# Patient Record
Sex: Female | Born: 1955 | Race: White | Hispanic: No | Smoking: Never smoker
Health system: Southern US, Community
[De-identification: ages and names within clinical notes are randomized; demographics above are authoritative.]

## PROBLEM LIST (undated history)

## (undated) DIAGNOSIS — D649 Anemia, unspecified: Secondary | ICD-10-CM

## (undated) DIAGNOSIS — G4700 Insomnia, unspecified: Secondary | ICD-10-CM

## (undated) DIAGNOSIS — M1990 Unspecified osteoarthritis, unspecified site: Secondary | ICD-10-CM

## (undated) DIAGNOSIS — I10 Essential (primary) hypertension: Secondary | ICD-10-CM

## (undated) DIAGNOSIS — K635 Polyp of colon: Secondary | ICD-10-CM

## (undated) DIAGNOSIS — E785 Hyperlipidemia, unspecified: Secondary | ICD-10-CM

## (undated) DIAGNOSIS — G43909 Migraine, unspecified, not intractable, without status migrainosus: Secondary | ICD-10-CM

## (undated) DIAGNOSIS — K2970 Gastritis, unspecified, without bleeding: Secondary | ICD-10-CM

## (undated) DIAGNOSIS — E119 Type 2 diabetes mellitus without complications: Secondary | ICD-10-CM

## (undated) DIAGNOSIS — K219 Gastro-esophageal reflux disease without esophagitis: Secondary | ICD-10-CM

## (undated) DIAGNOSIS — F419 Anxiety disorder, unspecified: Secondary | ICD-10-CM

## (undated) HISTORY — PX: BILATERAL CARPAL TUNNEL RELEASE: SHX6508

## (undated) HISTORY — PX: HAMMER TOE SURGERY: SHX385

---

## 2004-02-17 ENCOUNTER — Ambulatory Visit: Payer: Self-pay | Admitting: Family Medicine

## 2005-10-04 ENCOUNTER — Ambulatory Visit: Payer: Self-pay | Admitting: Family Medicine

## 2005-12-16 ENCOUNTER — Emergency Department: Payer: Self-pay | Admitting: Emergency Medicine

## 2005-12-16 ENCOUNTER — Other Ambulatory Visit: Payer: Self-pay

## 2006-11-21 ENCOUNTER — Ambulatory Visit: Payer: Self-pay | Admitting: Family Medicine

## 2006-11-25 ENCOUNTER — Ambulatory Visit: Payer: Self-pay | Admitting: Family Medicine

## 2007-01-09 DIAGNOSIS — K219 Gastro-esophageal reflux disease without esophagitis: Secondary | ICD-10-CM | POA: Insufficient documentation

## 2007-01-09 DIAGNOSIS — E119 Type 2 diabetes mellitus without complications: Secondary | ICD-10-CM

## 2007-01-09 DIAGNOSIS — I10 Essential (primary) hypertension: Secondary | ICD-10-CM | POA: Diagnosis present

## 2007-03-21 ENCOUNTER — Emergency Department: Payer: Self-pay | Admitting: Emergency Medicine

## 2008-01-08 ENCOUNTER — Ambulatory Visit: Payer: Self-pay | Admitting: Family Medicine

## 2008-01-23 ENCOUNTER — Emergency Department: Payer: Self-pay | Admitting: Emergency Medicine

## 2008-03-12 ENCOUNTER — Emergency Department: Payer: Self-pay | Admitting: Emergency Medicine

## 2008-03-16 DIAGNOSIS — E785 Hyperlipidemia, unspecified: Secondary | ICD-10-CM | POA: Diagnosis present

## 2009-03-08 ENCOUNTER — Ambulatory Visit: Payer: Self-pay | Admitting: Family Medicine

## 2009-11-13 ENCOUNTER — Emergency Department: Payer: Self-pay | Admitting: Emergency Medicine

## 2010-03-30 ENCOUNTER — Ambulatory Visit: Payer: Self-pay | Admitting: Family Medicine

## 2011-01-18 ENCOUNTER — Ambulatory Visit: Payer: Self-pay | Admitting: Internal Medicine

## 2011-03-01 DIAGNOSIS — F419 Anxiety disorder, unspecified: Secondary | ICD-10-CM | POA: Insufficient documentation

## 2011-05-24 ENCOUNTER — Ambulatory Visit: Payer: Self-pay | Admitting: Family Medicine

## 2012-08-05 ENCOUNTER — Ambulatory Visit: Payer: Self-pay | Admitting: Family Medicine

## 2013-01-14 ENCOUNTER — Emergency Department: Payer: Self-pay | Admitting: Emergency Medicine

## 2013-01-14 LAB — COMPREHENSIVE METABOLIC PANEL
Albumin: 4.4 g/dL (ref 3.4–5.0)
Alkaline Phosphatase: 107 U/L (ref 50–136)
Anion Gap: 7 (ref 7–16)
BUN: 14 mg/dL (ref 7–18)
Bilirubin,Total: 0.4 mg/dL (ref 0.2–1.0)
Calcium, Total: 8.9 mg/dL (ref 8.5–10.1)
Chloride: 99 mmol/L (ref 98–107)
Creatinine: 0.89 mg/dL (ref 0.60–1.30)
EGFR (Non-African Amer.): >60
Glucose: 176 mg/dL — ABNORMAL HIGH (ref 65–99)
Osmolality: 271 (ref 275–301)
SGOT(AST): 21 U/L (ref 15–37)

## 2013-01-14 LAB — LIPASE, BLOOD: Lipase: 169 U/L (ref 73–393)

## 2013-01-14 LAB — CBC
HCT: 36 % (ref 35.0–47.0)
HGB: 12.1 g/dL (ref 12.0–16.0)
MCHC: 33.6 g/dL (ref 32.0–36.0)
Platelet: 287 10*3/uL (ref 150–440)
RBC: 4.89 10*6/uL (ref 3.80–5.20)
RDW: 15.1 % — ABNORMAL HIGH (ref 11.5–14.5)
WBC: 8.9 10*3/uL (ref 3.6–11.0)

## 2013-01-14 LAB — URINALYSIS, COMPLETE
Leukocyte Esterase: NEGATIVE
Nitrite: NEGATIVE
Protein: NEGATIVE
RBC,UR: <1 /HPF (ref 0–5)
Squamous Epithelial: <1
WBC UR: <1 /HPF (ref 0–5)

## 2013-03-03 DIAGNOSIS — M5412 Radiculopathy, cervical region: Secondary | ICD-10-CM | POA: Insufficient documentation

## 2013-06-08 DIAGNOSIS — M47812 Spondylosis without myelopathy or radiculopathy, cervical region: Secondary | ICD-10-CM | POA: Insufficient documentation

## 2014-01-14 ENCOUNTER — Ambulatory Visit: Payer: Self-pay | Admitting: Family Medicine

## 2014-05-24 DIAGNOSIS — L853 Xerosis cutis: Secondary | ICD-10-CM | POA: Insufficient documentation

## 2014-10-10 ENCOUNTER — Emergency Department
Admission: EM | Admit: 2014-10-10 | Discharge: 2014-10-10 | Disposition: A | Discharge status: Home / Self Care | Attending: Emergency Medicine | Admitting: Emergency Medicine

## 2014-10-10 ENCOUNTER — Encounter: Payer: Self-pay | Admitting: Emergency Medicine

## 2014-10-10 ENCOUNTER — Emergency Department

## 2014-10-10 ENCOUNTER — Other Ambulatory Visit: Payer: Self-pay

## 2014-10-10 DIAGNOSIS — G43909 Migraine, unspecified, not intractable, without status migrainosus: Secondary | ICD-10-CM | POA: Diagnosis not present

## 2014-10-10 DIAGNOSIS — E119 Type 2 diabetes mellitus without complications: Secondary | ICD-10-CM | POA: Diagnosis not present

## 2014-10-10 DIAGNOSIS — I10 Essential (primary) hypertension: Secondary | ICD-10-CM | POA: Diagnosis not present

## 2014-10-10 HISTORY — DX: Type 2 diabetes mellitus without complications: E11.9

## 2014-10-10 HISTORY — DX: Essential (primary) hypertension: I10

## 2014-10-10 HISTORY — DX: Migraine, unspecified, not intractable, without status migrainosus: G43.909

## 2014-10-10 HISTORY — DX: Hyperlipidemia, unspecified: E78.5

## 2014-10-10 HISTORY — DX: Unspecified osteoarthritis, unspecified site: M19.90

## 2014-10-10 LAB — SEDIMENTATION RATE: SED RATE: 10 mm/h (ref 0–30)

## 2014-10-10 LAB — BASIC METABOLIC PANEL
Anion gap: 7 (ref 5–15)
BUN: 12 mg/dL (ref 6–20)
CALCIUM: 8.8 mg/dL — AB (ref 8.9–10.3)
CO2: 28 mmol/L (ref 22–32)
Chloride: 99 mmol/L — ABNORMAL LOW (ref 101–111)
Creatinine, Ser: 0.61 mg/dL (ref 0.44–1.00)
GFR calc Af Amer: >60 mL/min (ref >60)
GFR calc non Af Amer: >60 mL/min (ref >60)
GLUCOSE: 129 mg/dL — AB (ref 65–99)
POTASSIUM: 3.3 mmol/L — AB (ref 3.5–5.1)
Sodium: 134 mmol/L — ABNORMAL LOW (ref 135–145)

## 2014-10-10 LAB — CBC
HEMATOCRIT: 33 % — AB (ref 35.0–47.0)
HEMOGLOBIN: 10.7 g/dL — AB (ref 12.0–16.0)
MCH: 24 pg — AB (ref 26.0–34.0)
MCHC: 32.6 g/dL (ref 32.0–36.0)
MCV: 73.7 fL — AB (ref 80.0–100.0)
PLATELETS: 241 10*3/uL (ref 150–440)
RBC: 4.47 MIL/uL (ref 3.80–5.20)
RDW: 14.6 % — AB (ref 11.5–14.5)
WBC: 6.2 10*3/uL (ref 3.6–11.0)

## 2014-10-10 LAB — TROPONIN I: Troponin I: <0.03 ng/mL (ref <0.031)

## 2014-10-10 MED ORDER — KETOROLAC TROMETHAMINE 30 MG/ML IJ SOLN
INTRAMUSCULAR | Status: AC
  Filled 2014-10-10: qty 1

## 2014-10-10 MED ORDER — METOCLOPRAMIDE HCL 5 MG/ML IJ SOLN
INTRAMUSCULAR | Status: AC
  Filled 2014-10-10: qty 2

## 2014-10-10 MED ORDER — DIPHENHYDRAMINE HCL 50 MG/ML IJ SOLN
INTRAMUSCULAR | Status: AC
  Filled 2014-10-10: qty 1

## 2014-10-10 MED ORDER — IBUPROFEN 800 MG PO TABS
ORAL_TABLET | ORAL | Status: AC
  Filled 2014-10-10: qty 1

## 2014-10-10 MED ORDER — ACETAMINOPHEN 500 MG PO TABS
ORAL_TABLET | ORAL | Status: AC
  Filled 2014-10-10: qty 2

## 2014-10-10 MED ORDER — METOCLOPRAMIDE HCL 10 MG PO TABS
ORAL_TABLET | ORAL | Status: AC
  Filled 2014-10-10: qty 1

## 2014-10-10 MED ORDER — SUMATRIPTAN SUCCINATE 50 MG PO TABS: 50.0000 mg | ORAL_TABLET | Freq: Once | ORAL | Status: DC | PRN

## 2014-10-10 MED ORDER — SODIUM CHLORIDE 0.9 % IV SOLN: Freq: Once | INTRAVENOUS | Status: DC

## 2014-10-10 MED ORDER — ACETAMINOPHEN 500 MG PO TABS
1000.0000 mg | ORAL_TABLET | Freq: Once | ORAL | Status: AC
  Administered 2014-10-10: 1000 mg via ORAL

## 2014-10-10 MED ORDER — METOCLOPRAMIDE HCL 10 MG PO TABS
10.0000 mg | ORAL_TABLET | Freq: Once | ORAL | Status: AC
  Administered 2014-10-10: 10 mg via ORAL

## 2014-10-10 MED ORDER — DIPHENHYDRAMINE HCL 50 MG/ML IJ SOLN: 25.0000 mg | Freq: Once | INTRAMUSCULAR | Status: DC

## 2014-10-10 MED ORDER — IBUPROFEN 800 MG PO TABS
800.0000 mg | ORAL_TABLET | Freq: Once | ORAL | Status: AC
  Administered 2014-10-10: 800 mg via ORAL

## 2014-10-10 MED ORDER — KETOROLAC TROMETHAMINE 30 MG/ML IJ SOLN: 30.0000 mg | Freq: Once | INTRAMUSCULAR | Status: DC

## 2014-10-10 MED ORDER — METOCLOPRAMIDE HCL 5 MG/ML IJ SOLN: 10.0000 mg | Freq: Once | INTRAMUSCULAR | Status: DC

## 2014-10-10 NOTE — Discharge Instructions (Signed)

## 2014-10-10 NOTE — ED Provider Notes (Addendum)
[facility] Emergency Department Provider Note     Time seen: ----------------------------------------- 5:37 PM on 10/10/2014 -----------------------------------------    I have reviewed the triage vital signs and the nursing notes.   HISTORY  Chief Complaint Migraine    HPI Pamela Heath is a 59 y.o. female emergency ER for left-sided headache on and off last 2 months. Patient states its severe over the last 2 days, she normally works nights and has not been able to sleep due to the pain. She states she has a history of migraines but this is different than her typical headache. Denies fevers chills other complaints, nothing seems to make it better or worse. Has been seen by an eye doctor and was checked for glaucoma was told her I pressure was normal.   Past Medical History  Diagnosis Date  . Hypertension   . Diabetes mellitus without complication   . Migraines   . Hyperlipemia   . Arthritis     There are no active problems to display for this patient.   Past Surgical History  Procedure Laterality Date  . Bilateral carpal tunnel release    . Hammer toe surgery      Allergies Neurontin  Social History History  Substance Use Topics  . Smoking status: Never Smoker   . Smokeless tobacco: Never Used  . Alcohol Use: No    Review of Systems Constitutional: Negative for fever. Eyes: Positive for vision changes ENT: Negative for sore throat. Cardiovascular: Negative for chest pain. Respiratory: Negative for shortness of breath. Gastrointestinal: Negative for abdominal pain, vomiting and diarrhea. Genitourinary: Negative for dysuria. Musculoskeletal: Negative for back pain. Skin: Negative for rash. Neurological: Positive for headache on left side  10-point ROS otherwise negative.  ____________________________________________   PHYSICAL EXAM:  VITAL SIGNS: ED Triage Vitals  Enc Vitals Group     BP 10/10/14 1539 144/69 mmHg    Pulse Rate 10/10/14 1539 83     Resp 10/10/14 1539 18     Temp 10/10/14 1539 98 F (36.7 C)     Temp Source 10/10/14 1539 Oral     SpO2 10/10/14 1539 98 %     Weight 10/10/14 1539 160 lb (72.576 kg)     Height 10/10/14 1539 5\' 2"  (1.575 m)     Head Cir --      Peak Flow --      Pain Score 10/10/14 1542 8     Pain Loc --      Pain Edu? --      Excl. in GC? --     Constitutional: Alert and oriented. Well appearing and in no distress. Eyes: Conjunctivae are normal. PERRL. Normal extraocular movements. ENT   Head: Normocephalic and atraumatic.   Nose: No congestion/rhinnorhea.   Mouth/Throat: Mucous membranes are moist.   Neck: No stridor. Hematological/Lymphatic/Immunilogical: No cervical lymphadenopathy. Cardiovascular: Normal rate, regular rhythm. Normal and symmetric distal pulses are present in all extremities. No murmurs, rubs, or gallops. Respiratory: Normal respiratory effort without tachypnea nor retractions. Breath sounds are clear and equal bilaterally. No wheezes/rales/rhonchi. Gastrointestinal: Soft and nontender. No distention. No abdominal bruits. There is no CVA tenderness. Musculoskeletal: Nontender with normal range of motion in all extremities. No joint effusions.  No lower extremity tenderness nor edema. Neurologic:  Normal speech and language. No gross focal neurologic deficits are appreciated. Speech is normal. No gait instability. Skin:  Skin is warm, dry and intact. No rash noted. Psychiatric: Mood and affect are normal. Speech and behavior  are normal. Patient exhibits appropriate insight and judgment.  ____________________________________________  ED COURSE:  Pertinent labs & imaging results that were available during my care of the patient were reviewed by me and considered in my medical decision making (see chart for details). Patient will need a CT head, basic labs and reevaluation. ____________________________________________    LABS  (pertinent positives/negatives)  Labs Reviewed  CBC - Abnormal; Notable for the following:    Hemoglobin 10.7 (*)    HCT 33.0 (*)    MCV 73.7 (*)    MCH 24.0 (*)    RDW 14.6 (*)    All other components within normal limits  BASIC METABOLIC PANEL - Abnormal; Notable for the following:    Sodium 134 (*)    Potassium 3.3 (*)    Chloride 99 (*)    Glucose, Bld 129 (*)    Calcium 8.8 (*)    All other components within normal limits  TROPONIN I  SEDIMENTATION RATE    RADIOLOGY  FINDINGS: Normal appearing cerebral hemispheres and posterior fossa structures. Normal size and position of the ventricles. No intracranial hemorrhage, mass lesion or CT evidence of acute infarction. Unremarkable bones and included paranasal sinuses.  IMPRESSION: Normal examination.  ____________________________________________  FINAL ASSESSMENT AND PLAN  Headache  Plan: Unclear etiology for this headache. Her labs and workup here been unremarkable. Neurologic exam is normal. Should be advised to have close follow-up with her doctor for reevaluation. Sedimentation rate is normal.   Emily Filbert, MD   Emily Filbert, MD 10/10/14 1739  Emily Filbert, MD 10/10/14 [phone]

## 2014-10-10 NOTE — ED Notes (Signed)
Patient to Ed with c.o left sided headache that has been present " off an on" for two months. Patient states pain has been severe over the past two day. Patient denies any visual changes. Patient states she has a history of migraines. Patient states this pain feels different than previous headaches. No neurological deficits noted. Patient has clear speech. Patient can follow instructions and answer questions.

## 2014-10-10 NOTE — ED Notes (Addendum)
Headache for 2 months on left side. Hx of Migraines. Denies visual changes. For the past 2 nights pain in left head and arm pain. Pt states all my pain is in the left side.

## 2014-12-02 DIAGNOSIS — R519 Headache, unspecified: Secondary | ICD-10-CM | POA: Insufficient documentation

## 2015-01-05 ENCOUNTER — Other Ambulatory Visit: Payer: Self-pay | Admitting: Neurology

## 2015-01-05 DIAGNOSIS — M542 Cervicalgia: Secondary | ICD-10-CM

## 2015-01-13 ENCOUNTER — Ambulatory Visit: Attending: Neurology

## 2015-01-13 ENCOUNTER — Ambulatory Visit
Admission: RE | Admit: 2015-01-13 | Discharge: 2015-01-13 | Disposition: A | Discharge status: Home / Self Care | Source: Ambulatory Visit | Attending: Neurology | Admitting: Neurology

## 2015-01-13 DIAGNOSIS — M542 Cervicalgia: Secondary | ICD-10-CM | POA: Insufficient documentation

## 2015-01-13 DIAGNOSIS — M4802 Spinal stenosis, cervical region: Secondary | ICD-10-CM | POA: Insufficient documentation

## 2015-01-13 NOTE — Patient Instructions (Addendum)
[email or web address] Cervical retractions 2x10 in sitting or supine

## 2015-01-13 NOTE — Therapy (Addendum)
[place] [facility] MAIN [facility] SERVICES [address] [place], [place], [address] Phone: [phone]   Fax:  [phone]  Physical Therapy Evaluation  Patient Details  Name: CAMANI SESAY MRN: [id number] Date of Birth: [birth year] Referring Provider:  Morene Crocker, MD  Encounter Date: 01/13/2015      PT End of Session - 01/13/15 1819    Visit Number 1   Number of Visits 9   Date for PT Re-Evaluation 02/10/15   PT Start Time 1400   PT Stop Time 1500   PT Time Calculation (min) 60 min   Activity Tolerance Patient tolerated treatment well   Behavior During Therapy [facility] for tasks assessed/performed      Past Medical History  Diagnosis Date  . Hypertension   . Diabetes mellitus without complication   . Migraines   . Hyperlipemia   . Arthritis     Past Surgical History  Procedure Laterality Date  . Bilateral carpal tunnel release    . Hammer toe surgery      There were no vitals filed for this visit.  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  In June she went to the ER because she was having a severe headache but did not have a CVA.  She had cervical MRI performed this morning and will have the results soon.  Her MD offered her injects and she declined so he referred her for PT.  Pt notes her pain has worsen in the past two years and turning left increases her neck symptoms (stiffness and numbness/tingling).  She has noticed reading and increases her symptoms.  She currently has not symptoms.    Patient Stated Goals to decrease her stiffness in her neck    Currently in Pain? No/denies            [facility] PT Assessment - 01/13/15 0001    Assessment   Medical Diagnosis neck pain   Onset Date/Surgical Date 01/12/13   Hand Dominance Right   Next MD Visit 04/12/15   Prior Therapy none   Precautions   Precautions None   Restrictions   Weight Bearing Restrictions No   Balance Screen   Has the patient fallen in the past 6 months No   Has the patient had a decrease in activity level because of a fear of falling?  No   Is the patient reluctant to leave their home because of a fear of falling?  No   Home Environment   Living Environment Private residence   Living Arrangements Alone   Available Help at Discharge Family   Type of Home Mobile home   Home Access Stairs to enter   Entrance Stairs-Number of Steps 4   Entrance Stairs-Rails Right;Left   Home Layout One level   Home Equipment None   Prior Function   Level of Independence Independent   Vocation Full time employment   Lobbyist   Leisure read, walk   Cognition   Overall Cognitive Status Within Functional Limits for tasks assessed   Sensation   Light Touch Appears Intact   Coordination   Gross Motor Movements are Fluid and Coordinated Yes       PAIN: 0/10  POSTURE: Rounded shoulders, forward head, increased  kyphosis in sitting  AROM: Cervical flexion 40 Cervical extension: 38 R cervical rotation: 60 Left cervical rotation: 45 with pain    Left passive cervical rotation no pain and improved ROM ~50  Gross UE ROM and strength: WNL  Dermatomes UE WNL Myotomes WNL    SPECIAL TESTS: Spurling's: negative    Palpation:  No pain with Grade 4 mobs in cervical spine Pain with Grade 2 mob T1 Increased tension and pain in left upper trap   There ex:  Cervical retractions in supine and sitting x10 each Pt required mod-max verbal and tactile cues for correct technique Educated pt on using cervical roll while sleeping                     PT Education - 01/13/15 1818    Education provided Yes   Education Details plan of care, HEP (cervical retractions)    Person(s) Educated Patient    Methods Explanation;Demonstration   Comprehension Verbalized understanding;Returned demonstration;Verbal cues required             PT Long Term Goals - 01/13/15 1826    PT LONG TERM GOAL #1   Title pt will achieve 60 degrees of left cervical rotation to improve driving ability    Baseline 45 degrees   Time 4   Period Weeks   Status New   PT LONG TERM GOAL #2   Title pt will be independent with HEP to self manage her symptoms   Baseline pt required mod-max verbal cues for correct cervical retractions    Time 4   Period Weeks   Status New   PT LONG TERM GOAL #3   Title pt will be able to read for 20 minutes whout having neck symptoms      Baseline pt reports having neck stiffness when reading her bible     Time 4     Period Weeks   Status New               Plan - 01/13/15 1822    Clinical Impression Statement Pt is a 59 year old female with chronic history of neck pain.  Based on her history and exam, pt's symptoms are consisted with musculoskeletal involvement, she does not seem to have a peripheral nerve involvement regarding the cervical spine at this time.  Pt presents with decreased R cervical rotation and cervical retraction, and postural dysfunction.  Pt would benefit from skilled PT services to improve posture, ROM to return to PLOF.     Pt will benefit from skilled therapeutic intervention in order to improve on the following deficits Decreased range of motion;Increased fascial restricitons;Pain;Postural dysfunction;Decreased strength   Rehab Potential Good   PT Frequency 2x / week   PT Duration 4 weeks   PT Treatment/Interventions Aquatic Therapy;Cryotherapy;Moist Heat;Therapeutic exercise;Therapeutic activities;Functional mobility training;Manual techniques;Passive range of motion   PT Next Visit Plan increase postural strength          Problem List There are no active problems to display for this patient. Janus Molder, SPT This entire session was  performed under direct supervision and direction of a licensed Estate agent . I have personally read, edited and approve of the note as written.  Carlyon Shadow. Tortorici, PT, DPT [phone]   Tortorici,Ashley 01/13/2015, 6:47 PM  [place] [facility] MAIN [facility] SERVICES [address] [place], [place], [address] Phone: [phone]   Fax:  [phone]

## 2015-01-17 ENCOUNTER — Encounter

## 2015-01-19 ENCOUNTER — Encounter

## 2015-01-24 ENCOUNTER — Encounter

## 2015-01-27 ENCOUNTER — Encounter: Admitting: Physical Therapy

## 2015-01-27 DIAGNOSIS — G4700 Insomnia, unspecified: Secondary | ICD-10-CM | POA: Insufficient documentation

## 2015-01-31 ENCOUNTER — Encounter

## 2015-02-01 ENCOUNTER — Ambulatory Visit: Attending: Neurology

## 2015-02-01 DIAGNOSIS — M542 Cervicalgia: Secondary | ICD-10-CM | POA: Diagnosis not present

## 2015-02-02 NOTE — Therapy (Signed)
[place] [facility] [address] [place], [place], [address] Phone: [phone]   Fax:  [phone]  Physical Therapy Treatment  Patient Details  Name: Pamela Heath MRN: [id number] Date of Birth: [birth year] Referring Provider:  Morene CrockerPotter, Zachary E, MD  Encounter Date: 02/01/2015      PT End of Session - 02/02/15 1048    Visit Number 2   Number of Visits 9   Date for PT Re-Evaluation 02/10/15   PT Start Time 1645   PT Stop Time 1730   PT Time Calculation (min) 45 min   Activity Tolerance Patient tolerated treatment well   Behavior During Therapy [facility] for tasks assessed/performed      Past Medical History  Diagnosis Date  . Hypertension   . Diabetes mellitus without complication (HCC)   . Migraines   . Hyperlipemia   . Arthritis     Past Surgical History  Procedure Laterality Date  . Bilateral carpal tunnel release    . Hammer toe surgery      There were no vitals filed for this visit.  Visit Diagnosis:  Cervicalgia      Subjective Assessment - 02/02/15 1046    Subjective Pt relates her left sided neck pain is better since her last session and her symptoms decrease after doing her HEP.  Pt denies any pain currently   Patient Stated Goals to decrease her stiffness in her neck    Currently in Pain? No/denies        There ex: Scapular retractions 2x10, pt required mod-max verbal, visual and tactile cues to perform exercise, pt is inconsistent with exercise performance Chin tucks 2x10, pt required verbal, visual and tactile cues for correct technique  Left upper trap stretch x20 sec, increased tingling sensation in her neck Left levator stretch x20 sec, increased in tingling sensation in her neck  Pt required at least mod verbal, visual  and tactile cues for correct technique   Manual therapy: Cervical P/A mobs grade 2-3, 2x25 seconds each level,  Left lateral cervical glide grade 2-3 2x25 each level, pain  at C3 Passive cervical retractions x10 Cervical distraction x5 min with 10 sec pull/10 sec relax Pt reported decreased pain with left cervical rotation after manual therapy                            PT Education - 02/02/15 1047    Education provided Yes   Education Details plan of care and reviewed and update HEP   Person(s) Educated Patient   Methods Explanation   Comprehension Verbalized understanding             PT Long Term Goals - 01/13/15 1826    PT LONG TERM GOAL #1   Title pt will achieve 60 degrees of left cervical rotation to improve driving ability    Baseline 45 degrees   Time 4   Period Weeks   Status New   PT LONG TERM GOAL #2   Title pt will be independent with HEP to self manage her symptoms   Baseline pt required mod-max verbal cues for correct cervical retractions    Time 4   Period Weeks   Status New   PT LONG TERM GOAL #3   Title pt will be able to read for 20 minutes whout having neck symptoms   Simultaneous filing. User may not have seen previous data.   Baseline pt reports  having neck stiffness when reading her bible  Simultaneous filing. User may not have seen previous data.   Time 4  Simultaneous filing. User may not have seen previous data.   Period Weeks   Status New               Plan - 02/02/15 1049    Clinical Impression Statement Pt responded well to manual therapy with decreased pain and "stiffness."  Pt experienced tingling in her neck with stretches, which decreased after returning to neutral.  Pt will requires mod cueing to perform exercises due to poor body awareness   Pt will benefit from skilled therapeutic intervention in order to improve on the following deficits Decreased range of motion;Increased fascial restricitons;Pain;Postural dysfunction;Decreased strength   Rehab Potential Good   PT Frequency 2x / week   PT Duration 4 weeks   PT Treatment/Interventions Aquatic Therapy;Cryotherapy;Moist  Heat;Therapeutic exercise;Therapeutic activities;Functional mobility training;Manual techniques;Passive range of motion   PT Next Visit Plan increase postural strength         Problem List There are no active problems to display for this patient. Janus Molder, SPT This entire session was performed under direct supervision and direction of a licensed Estate agent . I have personally read, edited and approve of the note as written. Carlyon Shadow. Tortorici, PT, DPT [phone]   Tortorici,Ashley 02/02/2015, 10:53 AM  [place] [facility] MAIN [facility] SERVICES [address] [place], [place], [address] Phone: [phone]   Fax:  [phone]

## 2015-02-02 NOTE — Patient Instructions (Signed)
[email or web address] Scapular retractions 2x10 

## 2015-02-03 ENCOUNTER — Encounter

## 2015-02-08 ENCOUNTER — Encounter

## 2015-02-10 ENCOUNTER — Encounter

## 2015-02-10 ENCOUNTER — Ambulatory Visit

## 2015-02-15 ENCOUNTER — Ambulatory Visit

## 2015-02-15 ENCOUNTER — Encounter

## 2015-02-15 DIAGNOSIS — M542 Cervicalgia: Secondary | ICD-10-CM | POA: Diagnosis not present

## 2015-02-16 NOTE — Patient Instructions (Signed)
[email or web address] Left upper trap stretch 3x 30 sec Left levator scapulae stretch 3x30 sec

## 2015-02-16 NOTE — Therapy (Signed)
[place] MAIN [facility] SERVICES [address] [place], [place], [address] Phone: [phone]   Fax:  [phone]  Physical Therapy Treatment  Patient Details  Name: Pamela Heath MRN: [id number] Date of Birth: 07/13/1955 No Data Recorded  Encounter Date: 02/15/2015      PT End of Session - 02/16/15 0953    Visit Number 3   Number of Visits 9   Date for PT Re-Evaluation 03/16/15   PT Start Time 1600   PT Stop Time 1645   PT Time Calculation (min) 45 min   Activity Tolerance Patient tolerated treatment well   Behavior During Therapy [facility] for tasks assessed/performed      Past Medical History  Diagnosis Date  . Hypertension   . Diabetes mellitus without complication ([place])   . Migraines   . Hyperlipemia   . Arthritis     Past Surgical History  Procedure Laterality Date  . Bilateral carpal tunnel release    . Hammer toe surgery      There were no vitals filed for this visit.  Visit Diagnosis:  Cervicalgia      Subjective Assessment - 02/16/15 0952    Subjective Pt reports she has been having less tingling in her neck and that performing her exercises helps decrease her neck pain and using cervical pillow has also helped.     Patient Stated Goals to decrease her stiffness in her neck    Currently in Pain? No/denies      Manual therapy: Extensive soft tissue mobilization to left upper trap (ischemic compression, muscle stripping, effleurage) to decrease myofascial restrictions Pt noted decreased tension and improved mobility after manual treatment  There ex: Cervical AROM in sitting 60 degrees bilateral rotation but stiff to the left and with pain  45 degrees flexion  40 degrees extension L upper trap stretch 5x30 sec, pt required mod verbal, visual, and tactile cues for correct technique Scapular retractions x10, pt is inconsistent with exercise and required mod verbal, and tactile cues to decrease upper trap activation   Low row with red and yellow band x10 each, deferred after inconsistent performance with upper trap activation Shoulder extension with yellow and red and band x10 each, pt displays increased upper trap activation and is unable to perform exercise without shrugging her shoulders  Low row without resistance x 10 improved performance compared to with resistance but continues to demonstrate upper trap dominance  Wall slide with lift 2x10, pt required min cues for correct technique                           PT Education - 02/16/15 0952    Education provided Yes   Education Details plan of care, reviewed PT goals and benefits of soft tissue mobilization    Person(s) Educated Patient   Methods Explanation   Comprehension Verbalized understanding             PT Long Term Goals - 02/16/15 0956    PT LONG TERM GOAL #1   Title pt will achieve 60 degrees of left cervical rotation to improve driving ability    Baseline 45 degrees on 9/22; on 10/25 60 degrees with pain    Time 4   Period Weeks   Status Partially Met   PT LONG TERM GOAL #2   Title pt will be independent with HEP to self manage her symptoms   Baseline pt requires reinstruciton on correct exercise  technique    Time 4   Period Weeks   Status On-going   PT LONG TERM GOAL #3   Title pt will be able to read for 20 minutes without having neck symptoms    Baseline pt reports having neck stiffness when reading her bible on 9/22; on 10/26 pt reports no neck symptoms when reading her bible    Time 4   Period Weeks   Status Achieved   PT LONG TERM GOAL #4   Title pt will report decreased pain, less than 5/10 when lifting objects at work   Baseline pt reports pain in her neck when lifting objects from 3-7/10    Time 4   Period Weeks   Status New               Plan - 02/16/15 0955    Clinical Impression Statement Pt has met or partially met her initial goals for PT and demonstrates improved cervical  ROM.  Pt continues to experience right upper thoracic/low cervical pain/stiffness which improved after soft tissue mobilization and stretches.  Pt presents with upper trap dominance throughout session and will require further instruction to decrease upper trap involvement with functional activities to decrease pain.  pt would benefit from continued PT services to make further gains and decrease pain with functional movements.     Pt will benefit from skilled therapeutic intervention in order to improve on the following deficits Decreased range of motion;Increased fascial restricitons;Pain;Postural dysfunction;Decreased strength   Rehab Potential Good   PT Frequency 2x / week   PT Duration 4 weeks   PT Treatment/Interventions Aquatic Therapy;Cryotherapy;Moist Heat;Therapeutic exercise;Therapeutic activities;Functional mobility training;Manual techniques;Passive range of motion   PT Next Visit Plan increase postural strength. manual therapy         Problem List There are no active problems to display for this patient.  Renford Dills, SPT This entire session was performed under direct supervision and direction of a licensed Chiropractor . I have personally read, edited and approve of the note as written. Gorden Harms. Tortorici, PT, DPT [phone]  Tortorici,Ashley 02/16/2015, 1:15 PM  [place] MAIN [facility] SERVICES [address] [place], [place], [address] Phone: [phone]   Fax:  [phone]  Name: Pamela Heath MRN: [id number] Date of Birth: 11/06/1955

## 2015-02-17 ENCOUNTER — Encounter

## 2015-02-24 ENCOUNTER — Ambulatory Visit

## 2015-05-24 ENCOUNTER — Other Ambulatory Visit: Payer: Self-pay | Admitting: Physician Assistant

## 2015-05-24 DIAGNOSIS — G8929 Other chronic pain: Secondary | ICD-10-CM

## 2015-05-24 DIAGNOSIS — K2100 Gastro-esophageal reflux disease with esophagitis, without bleeding: Secondary | ICD-10-CM

## 2015-05-24 DIAGNOSIS — R1032 Left lower quadrant pain: Principal | ICD-10-CM

## 2015-05-27 ENCOUNTER — Ambulatory Visit

## 2015-06-01 ENCOUNTER — Ambulatory Visit
Admission: RE | Admit: 2015-06-01 | Discharge: 2015-06-01 | Disposition: A | Discharge status: Home / Self Care | Source: Ambulatory Visit | Attending: Physician Assistant | Admitting: Physician Assistant

## 2015-06-01 DIAGNOSIS — K219 Gastro-esophageal reflux disease without esophagitis: Secondary | ICD-10-CM | POA: Diagnosis not present

## 2015-06-01 DIAGNOSIS — K2100 Gastro-esophageal reflux disease with esophagitis, without bleeding: Secondary | ICD-10-CM

## 2015-06-01 DIAGNOSIS — R1031 Right lower quadrant pain: Secondary | ICD-10-CM | POA: Diagnosis present

## 2015-06-01 DIAGNOSIS — G8929 Other chronic pain: Secondary | ICD-10-CM

## 2015-06-01 DIAGNOSIS — R1032 Left lower quadrant pain: Secondary | ICD-10-CM

## 2015-06-01 MED ORDER — IOHEXOL 300 MG/ML  SOLN
100.0000 mL | Freq: Once | INTRAMUSCULAR | Status: AC | PRN
  Administered 2015-06-01: 100 mL via INTRAVENOUS

## 2015-06-10 ENCOUNTER — Ambulatory Visit: Admitting: Anesthesiology

## 2015-06-10 ENCOUNTER — Encounter: Payer: Self-pay | Admitting: Anesthesiology

## 2015-06-10 ENCOUNTER — Ambulatory Visit
Admission: RE | Admit: 2015-06-10 | Discharge: 2015-06-10 | Disposition: A | Discharge status: Home / Self Care | Source: Ambulatory Visit | Attending: Gastroenterology | Admitting: Gastroenterology

## 2015-06-10 ENCOUNTER — Encounter
Admission: RE | Disposition: A | Discharge status: Home / Self Care | Payer: Self-pay | Source: Ambulatory Visit | Attending: Gastroenterology

## 2015-06-10 DIAGNOSIS — Z7982 Long term (current) use of aspirin: Secondary | ICD-10-CM | POA: Diagnosis not present

## 2015-06-10 DIAGNOSIS — I10 Essential (primary) hypertension: Secondary | ICD-10-CM | POA: Diagnosis not present

## 2015-06-10 DIAGNOSIS — Z91018 Allergy to other foods: Secondary | ICD-10-CM | POA: Insufficient documentation

## 2015-06-10 DIAGNOSIS — K635 Polyp of colon: Secondary | ICD-10-CM

## 2015-06-10 DIAGNOSIS — G4700 Insomnia, unspecified: Secondary | ICD-10-CM | POA: Diagnosis not present

## 2015-06-10 DIAGNOSIS — Z1211 Encounter for screening for malignant neoplasm of colon: Secondary | ICD-10-CM | POA: Insufficient documentation

## 2015-06-10 DIAGNOSIS — F419 Anxiety disorder, unspecified: Secondary | ICD-10-CM | POA: Insufficient documentation

## 2015-06-10 DIAGNOSIS — K621 Rectal polyp: Secondary | ICD-10-CM | POA: Insufficient documentation

## 2015-06-10 DIAGNOSIS — Z79899 Other long term (current) drug therapy: Secondary | ICD-10-CM | POA: Diagnosis not present

## 2015-06-10 DIAGNOSIS — K219 Gastro-esophageal reflux disease without esophagitis: Secondary | ICD-10-CM | POA: Diagnosis not present

## 2015-06-10 DIAGNOSIS — M1990 Unspecified osteoarthritis, unspecified site: Secondary | ICD-10-CM | POA: Diagnosis not present

## 2015-06-10 DIAGNOSIS — E119 Type 2 diabetes mellitus without complications: Secondary | ICD-10-CM | POA: Insufficient documentation

## 2015-06-10 DIAGNOSIS — E785 Hyperlipidemia, unspecified: Secondary | ICD-10-CM | POA: Diagnosis not present

## 2015-06-10 DIAGNOSIS — Z888 Allergy status to other drugs, medicaments and biological substances status: Secondary | ICD-10-CM | POA: Diagnosis not present

## 2015-06-10 HISTORY — DX: Polyp of colon: K63.5

## 2015-06-10 HISTORY — PX: COLONOSCOPY WITH PROPOFOL: SHX5780

## 2015-06-10 LAB — GLUCOSE, CAPILLARY: Glucose-Capillary: 137 mg/dL — ABNORMAL HIGH (ref 65–99)

## 2015-06-10 SURGERY — COLONOSCOPY WITH PROPOFOL
Anesthesia: General

## 2015-06-10 MED ORDER — SODIUM CHLORIDE 0.9 % IV SOLN
INTRAVENOUS | Status: DC
  Administered 2015-06-10: 1000 mL via INTRAVENOUS

## 2015-06-10 MED ORDER — PROPOFOL 10 MG/ML IV BOLUS
INTRAVENOUS | Status: DC | PRN
  Administered 2015-06-10: 100 mg via INTRAVENOUS

## 2015-06-10 MED ORDER — PROPOFOL 500 MG/50ML IV EMUL
INTRAVENOUS | Status: DC | PRN
  Administered 2015-06-10: 120 ug/kg/min via INTRAVENOUS

## 2015-06-10 NOTE — Anesthesia Postprocedure Evaluation (Signed)
Anesthesia Post Note  Patient: Jetty Peeks  Procedure(s) Performed: Procedure(s) (LRB): COLONOSCOPY WITH PROPOFOL (N/A)  Patient location during evaluation: PACU Anesthesia Type: General Level of consciousness: awake and alert Pain management: pain level controlled Vital Signs Assessment: post-procedure vital signs reviewed and stable Respiratory status: spontaneous breathing, nonlabored ventilation, respiratory function stable and patient connected to nasal cannula oxygen Cardiovascular status: blood pressure returned to baseline and stable Postop Assessment: no signs of nausea or vomiting Anesthetic complications: no    Last Vitals:  Filed Vitals:   06/10/15 0850 06/10/15 0859  BP: 102/58 104/58  Pulse: 77 78  Temp:    Resp: 14 18    Last Pain: There were no vitals filed for this visit.               Lenard Simmer

## 2015-06-10 NOTE — Anesthesia Preprocedure Evaluation (Signed)
Anesthesia Evaluation  Patient identified by MRN, date of birth, ID band Patient awake    Reviewed: Allergy & Precautions, H&P , NPO status , Patient's Chart, lab work & pertinent test results, reviewed documented beta blocker date and time   Airway Mallampati: I  TM Distance: >3 FB Neck ROM: full    Dental no notable dental hx. (+) Caps, Teeth Intact, Loose   Pulmonary neg pulmonary ROS,    Pulmonary exam normal breath sounds clear to auscultation       Cardiovascular Exercise Tolerance: Good hypertension, On Medications (-) angina(-) CAD, (-) Past MI, (-) Cardiac Stents and (-) CABG Normal cardiovascular exam+ dysrhythmias (palpitations) (-) Valvular Problems/Murmurs Rhythm:regular Rate:Normal     Neuro/Psych negative neurological ROS  negative psych ROS   GI/Hepatic Neg liver ROS, GERD  ,  Endo/Other  diabetes  Renal/GU negative Renal ROS  negative genitourinary   Musculoskeletal   Abdominal   Peds  Hematology negative hematology ROS (+)   Anesthesia Other Findings Past Medical History:   Hypertension                                                 Diabetes mellitus without complication (HCC)                 Migraines                                                    Hyperlipemia                                                 Arthritis                                                    Reproductive/Obstetrics negative OB ROS                             Anesthesia Physical Anesthesia Plan  ASA: II  Anesthesia Plan: General   Post-op Pain Management:    Induction:   Airway Management Planned:   Additional Equipment:   Intra-op Plan:   Post-operative Plan:   Informed Consent: I have reviewed the patients History and Physical, chart, labs and discussed the procedure including the risks, benefits and alternatives for the proposed anesthesia with the patient or authorized  representative who has indicated his/her understanding and acceptance.   Dental Advisory Given  Plan Discussed with: Anesthesiologist, CRNA and Surgeon  Anesthesia Plan Comments:         Anesthesia Quick Evaluation

## 2015-06-10 NOTE — Transfer of Care (Signed)
Immediate Anesthesia Transfer of Care Note  Patient: Pamela Heath  Procedure(s) Performed: Procedure(s): COLONOSCOPY WITH PROPOFOL (N/A)  Patient Location: PACU  Anesthesia Type:General  Level of Consciousness: awake  Airway & Oxygen Therapy: Patient Spontanous Breathing  Post-op Assessment: Report given to RN  Post vital signs: Reviewed and stable  Last Vitals:  Filed Vitals:   06/10/15 0654 06/10/15 0839  BP: 134/60 100/84  Pulse: 81 85  Temp: 36.2 C 36.1 C  Resp: 16 16    Complications: No apparent anesthesia complications

## 2015-06-10 NOTE — H&P (Signed)
  Primary Care Physician:  Arlyss Queen, MD  Pre-Procedure History & Physical: HPI:  Pamela Heath is a 60 y.o. female is here for an colonoscopy.   Past Medical History  Diagnosis Date  . Hypertension   . Diabetes mellitus without complication (HCC)   . Migraines   . Hyperlipemia   . Arthritis     Past Surgical History  Procedure Laterality Date  . Bilateral carpal tunnel release    . Hammer toe surgery      Prior to Admission medications   Medication Sig Start Date End Date Taking? Authorizing Provider  aspirin 325 MG tablet Take 325 mg by mouth daily.   Yes Historical Provider, MD  glipizide-metformin (METAGLIP) 2.5-250 MG per tablet Take 1 tablet by mouth 2 (two) times daily before a meal.   Yes Historical Provider, MD  quinapril (ACCUPRIL) 10 MG tablet Take 10 mg by mouth daily.   Yes Historical Provider, MD  SUMAtriptan (IMITREX) 50 MG tablet Take 1 tablet (50 mg total) by mouth once as needed for migraine. May repeat in 2 hours if headache persists or recurs. 10/10/14 10/10/15 Yes Emily Filbert, MD    Allergies as of 06/07/2015 - Review Complete 02/16/2015  Allergen Reaction Noted  . Neurontin [gabapentin] Nausea And Vomiting 10/10/2014    History reviewed. No pertinent family history.  Social History   Social History  . Marital Status: Widowed    Spouse Name: N/A  . Number of Children: N/A  . Years of Education: N/A   Occupational History  . Not on file.   Social History Main Topics  . Smoking status: Never Smoker   . Smokeless tobacco: Never Used  . Alcohol Use: No  . Drug Use: No  . Sexual Activity: Not on file   Other Topics Concern  . Not on file   Social History Narrative     Physical Exam: BP 134/60 mmHg  Pulse 81  Temp(Src) 97.1 F (36.2 C) (Tympanic)  Resp 16  Ht  (1.575 m)  Wt 72.576 kg (160 lb)  BMI 29.26 kg/m2  SpO2 100% General:   Alert,  pleasant and cooperative in NAD Head:  Normocephalic and  atraumatic. Neck:  Supple; no masses or thyromegaly. Lungs:  Clear throughout to auscultation.    Heart:  Regular rate and rhythm. Abdomen:  Soft, nontender and nondistended. Normal bowel sounds, without guarding, and without rebound.   Neurologic:  Alert and  oriented x4;  grossly normal neurologically.  Impression/Plan: Pamela Heath is here for an colonoscopy to be performed for screening  Risks, benefits, limitations, and alternatives regarding  colonoscopy have been reviewed with the patient.  Questions have been answered.  All parties agreeable.   Elnita Maxwell, MD  06/10/2015, 8:10 AM

## 2015-06-10 NOTE — Discharge Instructions (Signed)

## 2015-06-10 NOTE — Op Note (Signed)
[facility] Gastroenterology Patient Name: Pamela Heath Procedure Date: 06/10/2015 7:21 AM MRN: [id number] Account #: [id number] Date of Birth: Apr 30, 1955 Admit Type: Outpatient Age: 60 Room: [facility] ENDO ROOM 2 Gender: Female Note Status: Finalized Procedure:            Colonoscopy Indications:          Screening for colorectal malignant neoplasm, This is                        the patient's first colonoscopy Patient Profile:      This is a 60 year old female. Providers:            Rhona Raider. Shelle Iron, MD Referring MD:         Karrie Doffing, MD (Referring MD) Medicines:            Propofol per Anesthesia Complications:        No immediate complications. Procedure:            Pre-Anesthesia Assessment:                       - Prior to the procedure, a History and Physical was                        performed, and patient medications, allergies and                        sensitivities were reviewed. The patient's tolerance of                        previous anesthesia was reviewed.                       After obtaining informed consent, the colonoscope was                        passed under direct vision. Throughout the procedure,                        the patient's blood pressure, pulse, and oxygen                        saturations were monitored continuously. The                        Colonoscope was introduced through the anus and                        advanced to the the cecum, identified by appendiceal                        orifice and ileocecal valve. The colonoscopy was                        performed without difficulty. The patient tolerated the                        procedure well. The quality of the bowel preparation                        was excellent. Findings:      A 3  mm polyp was found in the rectum. The polyp was sessile. The polyp       was removed with a jumbo cold forceps. Resection and retrieval were       complete.      The exam was  otherwise without abnormality on direct and retroflexion       views.      - Unable to intubate terminal ileum due to IC valve orientation. Impression:           - One 3 mm polyp in the rectum, removed with a jumbo                        cold forceps. Resected and retrieved.                       - The examination was otherwise normal on direct and                        retroflexion views. Recommendation:       - Observe patient in GI recovery unit.                       - Resume regular diet.                       - Continue present medications.                       - Await pathology results.                       - Repeat colonoscopy for surveillance based on                        pathology results.                       - Return to GI clinic.                       - Perform EGD and celiac panel since latest labs                        confirm IDA. To be discussed at next visit                       - The findings and recommendations were discussed with                        the patient.                       - The findings and recommendations were discussed with                        the patient's family. Procedure Code(s):    --- Professional ---                       [phone], Colonoscopy, flexible; with biopsy, single or                        multiple Diagnosis Code(s):    --- Professional ---  Z12.11, Encounter for screening for malignant neoplasm                        of colon                       K62.1, Rectal polyp CPT copyright 2016 American Medical Association. All rights reserved. The codes documented in this report are preliminary and upon coder review may  be revised to meet current compliance requirements. Kathalene Frames, MD 06/10/2015 8:38:55 AM This report has been signed electronically. Number of Addenda: 0 Note Initiated On: 06/10/2015 7:21 AM Scope Withdrawal Time: 0 hours 13 minutes 35 seconds  Total Procedure Duration: 0 hours 18  minutes 12 seconds       [facility]

## 2015-06-13 ENCOUNTER — Encounter: Payer: Self-pay | Admitting: Gastroenterology

## 2015-06-13 LAB — SURGICAL PATHOLOGY

## 2015-08-12 ENCOUNTER — Other Ambulatory Visit: Payer: Self-pay | Admitting: Family Medicine

## 2015-08-12 DIAGNOSIS — Z1231 Encounter for screening mammogram for malignant neoplasm of breast: Secondary | ICD-10-CM

## 2015-08-18 ENCOUNTER — Encounter: Payer: Self-pay | Admitting: *Deleted

## 2015-08-19 ENCOUNTER — Encounter
Admission: RE | Disposition: A | Discharge status: Home / Self Care | Payer: Self-pay | Source: Ambulatory Visit | Attending: Gastroenterology

## 2015-08-19 ENCOUNTER — Encounter: Payer: Self-pay | Admitting: *Deleted

## 2015-08-19 ENCOUNTER — Ambulatory Visit: Admitting: Certified Registered Nurse Anesthetist

## 2015-08-19 ENCOUNTER — Ambulatory Visit
Admission: RE | Admit: 2015-08-19 | Discharge: 2015-08-19 | Disposition: A | Discharge status: Home / Self Care | Source: Ambulatory Visit | Attending: Gastroenterology | Admitting: Gastroenterology

## 2015-08-19 DIAGNOSIS — G4700 Insomnia, unspecified: Secondary | ICD-10-CM | POA: Insufficient documentation

## 2015-08-19 DIAGNOSIS — K2960 Other gastritis without bleeding: Secondary | ICD-10-CM | POA: Diagnosis not present

## 2015-08-19 DIAGNOSIS — E119 Type 2 diabetes mellitus without complications: Secondary | ICD-10-CM | POA: Diagnosis not present

## 2015-08-19 DIAGNOSIS — Z86010 Personal history of colonic polyps: Secondary | ICD-10-CM | POA: Insufficient documentation

## 2015-08-19 DIAGNOSIS — Z888 Allergy status to other drugs, medicaments and biological substances status: Secondary | ICD-10-CM | POA: Insufficient documentation

## 2015-08-19 DIAGNOSIS — I10 Essential (primary) hypertension: Secondary | ICD-10-CM | POA: Insufficient documentation

## 2015-08-19 DIAGNOSIS — Z79899 Other long term (current) drug therapy: Secondary | ICD-10-CM | POA: Diagnosis not present

## 2015-08-19 DIAGNOSIS — M1990 Unspecified osteoarthritis, unspecified site: Secondary | ICD-10-CM | POA: Insufficient documentation

## 2015-08-19 DIAGNOSIS — D509 Iron deficiency anemia, unspecified: Secondary | ICD-10-CM | POA: Insufficient documentation

## 2015-08-19 DIAGNOSIS — Z7982 Long term (current) use of aspirin: Secondary | ICD-10-CM | POA: Insufficient documentation

## 2015-08-19 DIAGNOSIS — F419 Anxiety disorder, unspecified: Secondary | ICD-10-CM | POA: Insufficient documentation

## 2015-08-19 DIAGNOSIS — G43909 Migraine, unspecified, not intractable, without status migrainosus: Secondary | ICD-10-CM | POA: Insufficient documentation

## 2015-08-19 DIAGNOSIS — Z91018 Allergy to other foods: Secondary | ICD-10-CM | POA: Insufficient documentation

## 2015-08-19 DIAGNOSIS — E785 Hyperlipidemia, unspecified: Secondary | ICD-10-CM | POA: Insufficient documentation

## 2015-08-19 DIAGNOSIS — K219 Gastro-esophageal reflux disease without esophagitis: Secondary | ICD-10-CM | POA: Insufficient documentation

## 2015-08-19 HISTORY — DX: Insomnia, unspecified: G47.00

## 2015-08-19 HISTORY — DX: Gastro-esophageal reflux disease without esophagitis: K21.9

## 2015-08-19 HISTORY — DX: Polyp of colon: K63.5

## 2015-08-19 HISTORY — DX: Anxiety disorder, unspecified: F41.9

## 2015-08-19 HISTORY — PX: ESOPHAGOGASTRODUODENOSCOPY (EGD) WITH PROPOFOL: SHX5813

## 2015-08-19 LAB — GLUCOSE, CAPILLARY: GLUCOSE-CAPILLARY: 142 mg/dL — AB (ref 65–99)

## 2015-08-19 SURGERY — ESOPHAGOGASTRODUODENOSCOPY (EGD) WITH PROPOFOL
Anesthesia: General

## 2015-08-19 MED ORDER — LIDOCAINE HCL (CARDIAC) 20 MG/ML IV SOLN
INTRAVENOUS | Status: DC | PRN
  Administered 2015-08-19: 100 mg via INTRAVENOUS

## 2015-08-19 MED ORDER — FENTANYL CITRATE (PF) 100 MCG/2ML IJ SOLN
INTRAMUSCULAR | Status: DC | PRN
  Administered 2015-08-19: 50 ug via INTRAVENOUS

## 2015-08-19 MED ORDER — SODIUM CHLORIDE 0.9 % IV SOLN
INTRAVENOUS | Status: DC
  Administered 2015-08-19: 08:00:00 via INTRAVENOUS

## 2015-08-19 MED ORDER — PROPOFOL 10 MG/ML IV BOLUS
INTRAVENOUS | Status: DC | PRN
  Administered 2015-08-19: 10 mg via INTRAVENOUS
  Administered 2015-08-19: 50 mg via INTRAVENOUS

## 2015-08-19 MED ORDER — PROPOFOL 500 MG/50ML IV EMUL
INTRAVENOUS | Status: DC | PRN
  Administered 2015-08-19: 140 ug/kg/min via INTRAVENOUS

## 2015-08-19 NOTE — Op Note (Signed)
[facility] Gastroenterology Patient Name: Pamela Heath Procedure Date: 08/19/2015 8:45 AM MRN: [id number] Account #: [id number] Date of Birth: 1956-01-15 Admit Type: Outpatient Age: 89 Room: [facility] ENDO ROOM 2 Gender: Female Note Status: Finalized Procedure:            Upper GI endoscopy Indications:          Iron deficiency anemia Patient Profile:      This is a 60 year old female. Providers:            Rhona RaiderMatthew G. Shelle Ironein, MD Referring MD:         Karrie DoffingWilliam Selvidge, MD (Referring MD) Medicines:            Propofol per Anesthesia Complications:        No immediate complications. Estimated blood loss:                        Minimal. Procedure:            Pre-Anesthesia Assessment:                       - Prior to the procedure, a History and Physical was                        performed, and patient medications, allergies and                        sensitivities were reviewed. The patient's tolerance of                        previous anesthesia was reviewed.                       After obtaining informed consent, the endoscope was                        passed under direct vision. Throughout the procedure,                        the patient's blood pressure, pulse, and oxygen                        saturations were monitored continuously. The Endoscope                        was introduced through the mouth, and advanced to the                        second part of duodenum. The upper GI endoscopy was                        accomplished without difficulty. The patient tolerated                        the procedure well. Findings:      The esophagus was normal.      Localized mild inflammation characterized by erythema and friability was       found in the gastric body. Biopsies were taken with a cold forceps for       histology.      The examined duodenum was normal. Impression:           -  Normal esophagus.                       - Gastritis. Biopsied.              - Normal examined duodenum.                       - No source for IDA on this study Recommendation:       - Observe patient in GI recovery unit.                       - Resume regular diet.                       - Continue present medications.                       - Await pathology results.                       - Check iron studies and Hgb after 8 weeks of BID oral                        iron.                       - Obtain capsule endoscopy if no improvement on labs                        with oral iron.                       - The findings and recommendations were discussed with                        the patient.                       - The findings and recommendations were discussed with                        the patient's family. Procedure Code(s):    --- Professional ---                       [phone], Esophagogastroduodenoscopy, flexible, transoral;                        with biopsy, single or multiple Diagnosis Code(s):    --- Professional ---                       K29.70, Gastritis, unspecified, without bleeding                       D50.9, Iron deficiency anemia, unspecified CPT copyright 2016 American Medical Association. All rights reserved. The codes documented in this report are preliminary and upon coder review may  be revised to meet current compliance requirements. Kathalene Frames, MD 08/19/2015 9:02:27 AM This report has been signed electronically. Number of Addenda: 0 Note Initiated On: 08/19/2015 8:45 AM      [facility]

## 2015-08-19 NOTE — H&P (Signed)
Primary Care Physician:  Arlyss QueenSELVIDGE,WILLIAM M, MD  Pre-Procedure History & Physical: HPI:  Pamela Heath is a 60 y.o. female is here for an endoscopy.   Past Medical History  Diagnosis Date  . Hypertension   . Diabetes mellitus without complication (HCC)   . Migraines   . Hyperlipemia   . Arthritis   . Anxiety   . GERD (gastroesophageal reflux disease)   . Hyperplastic colon polyp 06/10/2015  . Insomnia     Past Surgical History  Procedure Laterality Date  . Bilateral carpal tunnel release    . Hammer toe surgery    . Colonoscopy with propofol N/A 06/10/2015    Procedure: COLONOSCOPY WITH PROPOFOL;  Surgeon: Elnita MaxwellMatthew Gordon Aubriana Ravelo, MD;  Location[facility] ENDOSCOPY;  Service: Endoscopy;  Laterality: N/A;    Prior to Admission medications   Medication Sig Start Date End Date Taking? Authorizing Provider  aspirin 325 MG tablet Take 325 mg by mouth daily.   Yes Historical Provider, MD  atorvastatin (LIPITOR) 40 MG tablet Take 40 mg by mouth daily.   Yes Historical Provider, MD  diclofenac (VOLTAREN) 75 MG EC tablet Take 75 mg by mouth daily.   Yes Historical Provider, MD  diclofenac sodium (VOLTAREN) 1 % GEL Apply 2 g topically 2 (two) times daily.   Yes Historical Provider, MD  ferrous sulfate 325 (65 FE) MG tablet Take 325 mg by mouth 3 (three) times daily with meals.   Yes Historical Provider, MD  hydrochlorothiazide (MICROZIDE) 12.5 MG capsule Take 12.5 mg by mouth daily.   Yes Historical Provider, MD  metoprolol tartrate (LOPRESSOR) 25 MG tablet Take 25 mg by mouth daily.   Yes Historical Provider, MD  Multiple Vitamins-Minerals (OCUVITE PO) Take 1 tablet by mouth daily.   Yes Historical Provider, MD  omeprazole (PRILOSEC) 20 MG capsule Take 20 mg by mouth daily.   Yes Historical Provider, MD  quinapril (ACCUPRIL) 10 MG tablet Take 40 mg by mouth daily.    Yes Historical Provider, MD  traMADol (ULTRAM) 50 MG tablet Take 50 mg by mouth 2 (two) times daily as needed.   Yes Historical  Provider, MD  traZODone (DESYREL) 50 MG tablet Take 50 mg by mouth at bedtime.   Yes Historical Provider, MD  glipizide-metformin (METAGLIP) 2.5-250 MG per tablet Take 1 tablet by mouth 2 (two) times daily before a meal.    Historical Provider, MD  SUMAtriptan (IMITREX) 50 MG tablet Take 1 tablet (50 mg total) by mouth once as needed for migraine. May repeat in 2 hours if headache persists or recurs. 10/10/14 10/10/15  Emily FilbertJonathan E Williams, MD    Allergies as of 08/10/2015 - Review Complete 06/10/2015  Allergen Reaction Noted  . Neurontin [gabapentin] Nausea And Vomiting 10/10/2014  . Watermelon [citrullus vulgaris]  06/10/2015    History reviewed. No pertinent family history.  Social History   Social History  . Marital Status: Widowed    Spouse Name: N/A  . Number of Children: N/A  . Years of Education: N/A   Occupational History  . Not on file.   Social History Main Topics  . Smoking status: Never Smoker   . Smokeless tobacco: Never Used  . Alcohol Use: No  . Drug Use: No  . Sexual Activity: Not on file   Other Topics Concern  . Not on file   Social History Narrative     Physical Exam: BP 151/71 mmHg  Pulse 83  Temp(Src) 98.2 F (36.8 C) (Tympanic)  Resp 18  Ht  (1.575 m)  Wt 72.576 kg (160 lb)  BMI 29.26 kg/m2  SpO2 99% General:   Alert,  pleasant and cooperative in NAD Head:  Normocephalic and atraumatic. Neck:  Supple; no masses or thyromegaly. Lungs:  Clear throughout to auscultation.    Heart:  Regular rate and rhythm. Abdomen:  Soft, nontender and nondistended. Normal bowel sounds, without guarding, and without rebound.   Neurologic:  Alert and  oriented x4;  grossly normal neurologically.  Impression/Plan: Pamela Peeks is here for an endoscopy to be performed for IDA  Risks, benefits, limitations, and alternatives regarding  endoscopy have been reviewed with the patient.  Questions have been answered.  All parties agreeable.   Elnita Maxwell, MD  08/19/2015, 8:44 AM

## 2015-08-19 NOTE — Anesthesia Preprocedure Evaluation (Signed)
Anesthesia Evaluation  Patient identified by MRN, date of birth, ID band Patient awake    Reviewed: Allergy & Precautions, H&P , NPO status , Patient's Chart, lab work & pertinent test results, reviewed documented beta blocker date and time   Airway Mallampati: I  TM Distance: >3 FB Neck ROM: full    Dental no notable dental hx. (+) Caps, Teeth Intact, Loose   Pulmonary neg pulmonary ROS,    Pulmonary exam normal breath sounds clear to auscultation       Cardiovascular Exercise Tolerance: Good hypertension, On Medications (-) angina(-) CAD, (-) Past MI, (-) Cardiac Stents and (-) CABG Normal cardiovascular exam+ dysrhythmias (palpitations) (-) Valvular Problems/Murmurs Rhythm:regular Rate:Normal     Neuro/Psych negative neurological ROS  negative psych ROS   GI/Hepatic Neg liver ROS, GERD  ,  Endo/Other  diabetes  Renal/GU negative Renal ROS  negative genitourinary   Musculoskeletal   Abdominal   Peds  Hematology negative hematology ROS (+)   Anesthesia Other Findings Past Medical History:   Hypertension                                                 Diabetes mellitus without complication (HCC)                 Migraines                                                    Hyperlipemia                                                 Arthritis                                                    Reproductive/Obstetrics negative OB ROS                             Anesthesia Physical  Anesthesia Plan  ASA: II  Anesthesia Plan: General   Post-op Pain Management:    Induction:   Airway Management Planned:   Additional Equipment:   Intra-op Plan:   Post-operative Plan:   Informed Consent: I have reviewed the patients History and Physical, chart, labs and discussed the procedure including the risks, benefits and alternatives for the proposed anesthesia with the patient or authorized  representative who has indicated his/her understanding and acceptance.   Dental Advisory Given  Plan Discussed with: Anesthesiologist, CRNA and Surgeon  Anesthesia Plan Comments:         Anesthesia Quick Evaluation

## 2015-08-19 NOTE — Transfer of Care (Signed)
Immediate Anesthesia Transfer of Care Note  Patient: Pamela Heath  Procedure(s) Performed: Procedure(s): ESOPHAGOGASTRODUODENOSCOPY (EGD) WITH PROPOFOL (N/A)  Patient Location: PACU  Anesthesia Type:General  Level of Consciousness: awake and alert   Airway & Oxygen Therapy: Patient Spontanous Breathing and Patient connected to nasal cannula oxygen  Post-op Assessment: Report given to RN and Post -op Vital signs reviewed and stable  Post vital signs: Reviewed and stable  Last Vitals:  Filed Vitals:   08/19/15 0757  BP: 151/71  Pulse: 83  Temp: 36.8 C  Resp: 18    Last Pain: There were no vitals filed for this visit.       Complications: No anesthesia complications

## 2015-08-19 NOTE — Anesthesia Postprocedure Evaluation (Signed)
Anesthesia Post Note  Patient: Pamela Heath  Procedure(s) Performed: Procedure(s) (LRB): ESOPHAGOGASTRODUODENOSCOPY (EGD) WITH PROPOFOL (N/A)  Patient location during evaluation: Endoscopy Anesthesia Type: General Level of consciousness: awake and alert Pain management: pain level controlled Vital Signs Assessment: post-procedure vital signs reviewed and stable Respiratory status: spontaneous breathing, nonlabored ventilation, respiratory function stable and patient connected to nasal cannula oxygen Cardiovascular status: blood pressure returned to baseline and stable Postop Assessment: no signs of nausea or vomiting Anesthetic complications: no    Last Vitals:  Filed Vitals:   08/19/15 0924 08/19/15 0934  BP: 142/63 149/67  Pulse: 78 74  Temp:    Resp: 18 13    Last Pain: There were no vitals filed for this visit.               Lenard SimmerAndrew Elvert Cumpton

## 2015-08-19 NOTE — Anesthesia Procedure Notes (Signed)
Performed by: Katherine Syme Pre-anesthesia Checklist: Patient identified, Emergency Drugs available, Suction available, Patient being monitored and Timeout performed Patient Re-evaluated:Patient Re-evaluated prior to inductionOxygen Delivery Method: Nasal cannula Intubation Type: IV induction       

## 2015-08-22 ENCOUNTER — Encounter: Payer: Self-pay | Admitting: Gastroenterology

## 2015-08-22 LAB — SURGICAL PATHOLOGY

## 2015-08-30 ENCOUNTER — Ambulatory Visit
Admission: RE | Admit: 2015-08-30 | Discharge: 2015-08-30 | Disposition: A | Discharge status: Home / Self Care | Source: Ambulatory Visit | Attending: Family Medicine | Admitting: Family Medicine

## 2015-08-30 DIAGNOSIS — Z1231 Encounter for screening mammogram for malignant neoplasm of breast: Secondary | ICD-10-CM | POA: Diagnosis present

## 2015-10-13 ENCOUNTER — Emergency Department
Admission: EM | Admit: 2015-10-13 | Discharge: 2015-10-13 | Disposition: A | Discharge status: Home / Self Care | Attending: Emergency Medicine | Admitting: Emergency Medicine

## 2015-10-13 ENCOUNTER — Emergency Department

## 2015-10-13 DIAGNOSIS — E785 Hyperlipidemia, unspecified: Secondary | ICD-10-CM | POA: Insufficient documentation

## 2015-10-13 DIAGNOSIS — I10 Essential (primary) hypertension: Secondary | ICD-10-CM | POA: Diagnosis not present

## 2015-10-13 DIAGNOSIS — E119 Type 2 diabetes mellitus without complications: Secondary | ICD-10-CM | POA: Diagnosis not present

## 2015-10-13 DIAGNOSIS — G8929 Other chronic pain: Secondary | ICD-10-CM

## 2015-10-13 DIAGNOSIS — Z79899 Other long term (current) drug therapy: Secondary | ICD-10-CM | POA: Diagnosis not present

## 2015-10-13 DIAGNOSIS — M1990 Unspecified osteoarthritis, unspecified site: Secondary | ICD-10-CM | POA: Diagnosis not present

## 2015-10-13 DIAGNOSIS — Z7982 Long term (current) use of aspirin: Secondary | ICD-10-CM | POA: Diagnosis not present

## 2015-10-13 DIAGNOSIS — R109 Unspecified abdominal pain: Secondary | ICD-10-CM

## 2015-10-13 DIAGNOSIS — R1031 Right lower quadrant pain: Secondary | ICD-10-CM | POA: Insufficient documentation

## 2015-10-13 LAB — URINALYSIS COMPLETE WITH MICROSCOPIC (ARMC ONLY)
BILIRUBIN URINE: NEGATIVE
Bacteria, UA: NONE SEEN
GLUCOSE, UA: NEGATIVE mg/dL
HGB URINE DIPSTICK: NEGATIVE
KETONES UR: NEGATIVE mg/dL
Leukocytes, UA: NEGATIVE
NITRITE: NEGATIVE
Protein, ur: NEGATIVE mg/dL
SPECIFIC GRAVITY, URINE: 1.009 (ref 1.005–1.030)
pH: 7 (ref 5.0–8.0)

## 2015-10-13 LAB — COMPREHENSIVE METABOLIC PANEL
ALT: 23 U/L (ref 14–54)
ANION GAP: 12 (ref 5–15)
AST: 25 U/L (ref 15–41)
Albumin: 4.8 g/dL (ref 3.5–5.0)
Alkaline Phosphatase: 80 U/L (ref 38–126)
BILIRUBIN TOTAL: 0.6 mg/dL (ref 0.3–1.2)
BUN: 8 mg/dL (ref 6–20)
CALCIUM: 8.9 mg/dL (ref 8.9–10.3)
CO2: 26 mmol/L (ref 22–32)
Chloride: 94 mmol/L — ABNORMAL LOW (ref 101–111)
Creatinine, Ser: 0.67 mg/dL (ref 0.44–1.00)
GLUCOSE: 214 mg/dL — AB (ref 65–99)
POTASSIUM: 3.3 mmol/L — AB (ref 3.5–5.1)
Sodium: 132 mmol/L — ABNORMAL LOW (ref 135–145)
TOTAL PROTEIN: 7.5 g/dL (ref 6.5–8.1)

## 2015-10-13 LAB — CBC
HCT: 38 % (ref 35.0–47.0)
Hemoglobin: 12.8 g/dL (ref 12.0–16.0)
MCH: 25.5 pg — ABNORMAL LOW (ref 26.0–34.0)
MCHC: 33.8 g/dL (ref 32.0–36.0)
MCV: 75.6 fL — ABNORMAL LOW (ref 80.0–100.0)
PLATELETS: 261 10*3/uL (ref 150–440)
RBC: 5.03 MIL/uL (ref 3.80–5.20)
RDW: 14.3 % (ref 11.5–14.5)
WBC: 6.7 10*3/uL (ref 3.6–11.0)

## 2015-10-13 LAB — LIPASE, BLOOD: Lipase: 87 U/L — ABNORMAL HIGH (ref 11–51)

## 2015-10-13 NOTE — Discharge Instructions (Signed)
You have been seen in the Emergency Department (ED) for abdominal pain.  Your evaluation did not identify a clear cause of your symptoms but was generally reassuring.  Please follow up as instructed above regarding todays emergent visit and the symptoms that are bothering you.  As we discussed, we cannot provide narcotics for chronic pain according to the [facility] Chronic Pain policy.  Return to the ED if your abdominal pain worsens or fails to improve, you develop bloody vomiting, bloody diarrhea, you are unable to tolerate fluids due to vomiting, fever greater than 101, or other symptoms that concern you.   Abdominal Pain, Adult Many things can cause abdominal pain. Usually, abdominal pain is not caused by a disease and will improve without treatment. It can often be observed and treated at home. Your health care provider will do a physical exam and possibly order blood tests and X-rays to help determine the seriousness of your pain. However, in many cases, more time must pass before a clear cause of the pain can be found. Before that point, your health care provider may not know if you need more testing or further treatment. HOME CARE INSTRUCTIONS Monitor your abdominal pain for any changes. The following actions may help to alleviate any discomfort you are experiencing:  Only take over-the-counter or prescription medicines as directed by your health care provider.  Do not take laxatives unless directed to do so by your health care provider.  Try a clear liquid diet (broth, tea, or water) as directed by your health care provider. Slowly move to a bland diet as tolerated. SEEK MEDICAL CARE IF:  You have unexplained abdominal pain.  You have abdominal pain associated with nausea or diarrhea.  You have pain when you urinate or have a bowel movement.  You experience abdominal pain that wakes you in the night.  You have abdominal pain that is worsened or improved by eating food.  You  have abdominal pain that is worsened with eating fatty foods.  You have a fever. SEEK IMMEDIATE MEDICAL CARE IF:  Your pain does not go away within 2 hours.  You keep throwing up (vomiting).  Your pain is felt only in portions of the abdomen, such as the right side or the left lower portion of the abdomen.  You pass bloody or black tarry stools. MAKE SURE YOU:  Understand these instructions.  Will watch your condition.  Will get help right away if you are not doing well or get worse.   This information is not intended to replace advice given to you by your health care provider. Make sure you discuss any questions you have with your health care provider.   Document Released: 01/17/2005 Document Revised: 12/29/2014 Document Reviewed: 12/17/2012 Elsevier Interactive Patient Education 2016 Elsevier Inc.  Chronic Pain Chronic pain can be defined as pain that is off and on and lasts for 3-6 months or longer. Many things cause chronic pain, which can make it difficult to make a diagnosis. There are many treatment options available for chronic pain. However, finding a treatment that works well for you may require trying various approaches until the right one is found. Many people benefit from a combination of two or more types of treatment to control their pain. SYMPTOMS  Chronic pain can occur anywhere in the body and can range from mild to very severe. Some types of chronic pain include:  Headache.  Low back pain.  Cancer pain.  Arthritis pain.  Neurogenic pain. This is  pain resulting from damage to nerves. People with chronic pain may also have other symptoms such as:  Depression.  Anger.  Insomnia.  Anxiety. DIAGNOSIS  Your health care provider will help diagnose your condition over time. In many cases, the initial focus will be on excluding possible conditions that could be causing the pain. Depending on your symptoms, your health care provider may order tests to  diagnose your condition. Some of these tests may include:   Blood tests.   CT scan.   MRI.   X-rays.   Ultrasounds.   Nerve conduction studies.  You may need to see a specialist.  TREATMENT  Finding treatment that works well may take time. You may be referred to a pain specialist. He or she may prescribe medicine or therapies, such as:   Mindful meditation or yoga.  Shots (injections) of numbing or pain-relieving medicines into the spine or area of pain.  Local electrical stimulation.  Acupuncture.   Massage therapy.   Aroma, color, light, or sound therapy.   Biofeedback.   Working with a physical therapist to keep from getting stiff.   Regular, gentle exercise.   Cognitive or behavioral therapy.   Group support.  Sometimes, surgery may be recommended.  HOME CARE INSTRUCTIONS   Take all medicines as directed by your health care provider.   Lessen stress in your life by relaxing and doing things such as listening to calming music.   Exercise or be active as directed by your health care provider.   Eat a healthy diet and include things such as vegetables, fruits, fish, and lean meats in your diet.   Keep all follow-up appointments with your health care provider.   Attend a support group with others suffering from chronic pain. SEEK MEDICAL CARE IF:   Your pain gets worse.   You develop a new pain that was not there before.   You cannot tolerate medicines given to you by your health care provider.   You have new symptoms since your last visit with your health care provider.  SEEK IMMEDIATE MEDICAL CARE IF:   You feel weak.   You have decreased sensation or numbness.   You lose control of bowel or bladder function.   Your pain suddenly gets much worse.   You develop shaking.  You develop chills.  You develop confusion.  You develop chest pain.  You develop shortness of breath.  MAKE SURE YOU:  Understand these  instructions.  Will watch your condition.  Will get help right away if you are not doing well or get worse.   This information is not intended to replace advice given to you by your health care provider. Make sure you discuss any questions you have with your health care provider.   Document Released: 12/30/2001 Document Revised: 12/10/2012 Document Reviewed: 10/03/2012 Elsevier Interactive Patient Education [employer]2016 Elsevier Inc.

## 2015-10-13 NOTE — ED Provider Notes (Signed)
[facility] Emergency Department Provider Note  ____________________________________________  Time seen: Approximately 6:06 PM  I have reviewed the triage vital signs and the nursing notes.   HISTORY  Chief Complaint Abdominal Pain    HPI Pamela Heath is a 60 y.o. female history of chronic abdominal pain who his been evaluated by primary care doctor as well as GI (both Drs. Shelle Ironein and [place]) and has had 2 negative CT scans of the abdomen and pelvis as well as unremarkable upper and lower endoscopies.  She presents today for acute on chronic abdominal pain.  She states that it is been in stent but waxing and waning for several months, worse over the last 2-3 weeks.  She decided she should get it checked out today since it is continuing to bother her.  She denies nausea and vomiting but no she has had some loose stools for the last 2 days.  She denies fever/chills, chest pain, shortness of breath.  She denies ever having had any abdominal surgeries.  She is not having difficulty tolerating by mouth intake.  She denies any dark or tarry stools and no blood in her stool although she was evaluated previously for the possibility of a slow GI bleed.  She describes the pain as being mostly on her right side and being an aching moderate in intensity discomfort.   Past Medical History  Diagnosis Date  . Hypertension   . Diabetes mellitus without complication (HCC)   . Migraines   . Hyperlipemia   . Arthritis   . Anxiety   . GERD (gastroesophageal reflux disease)   . Hyperplastic colon polyp 06/10/2015  . Insomnia     There are no active problems to display for this patient.   Past Surgical History  Procedure Laterality Date  . Bilateral carpal tunnel release    . Hammer toe surgery    . Colonoscopy with propofol N/A 06/10/2015    Procedure: COLONOSCOPY WITH PROPOFOL;  Surgeon: Elnita MaxwellMatthew Gordon Rein, MD;  Location[facility] ENDOSCOPY;  Service: Endoscopy;   Laterality: N/A;  . Esophagogastroduodenoscopy (egd) with propofol N/A 08/19/2015    Procedure: ESOPHAGOGASTRODUODENOSCOPY (EGD) WITH PROPOFOL;  Surgeon: Elnita MaxwellMatthew Gordon Rein, MD;  Location[facility] ENDOSCOPY;  Service: Endoscopy;  Laterality: N/A;    Current Outpatient Rx  Name  Route  Sig  Dispense  Refill  . aspirin 325 MG tablet   Oral   Take 325 mg by mouth daily.         Marland Kitchen. atorvastatin (LIPITOR) 40 MG tablet   Oral   Take 40 mg by mouth daily.         . diclofenac (VOLTAREN) 75 MG EC tablet   Oral   Take 75 mg by mouth daily.         . diclofenac sodium (VOLTAREN) 1 % GEL   Topical   Apply 2 g topically 2 (two) times daily.         . ferrous sulfate 325 (65 FE) MG tablet   Oral   Take 325 mg by mouth 3 (three) times daily with meals.         Marland Kitchen. glipizide-metformin (METAGLIP) 2.5-250 MG per tablet   Oral   Take 1 tablet by mouth 2 (two) times daily before a meal.         . hydrochlorothiazide (MICROZIDE) 12.5 MG capsule   Oral   Take 12.5 mg by mouth daily.         . metoprolol tartrate (LOPRESSOR)  25 MG tablet   Oral   Take 25 mg by mouth daily.         . Multiple Vitamins-Minerals (OCUVITE PO)   Oral   Take 1 tablet by mouth daily.         Marland Kitchen. omeprazole (PRILOSEC) 20 MG capsule   Oral   Take 20 mg by mouth daily.         . quinapril (ACCUPRIL) 10 MG tablet   Oral   Take 40 mg by mouth daily.          Marland Kitchen. EXPIRED: SUMAtriptan (IMITREX) 50 MG tablet   Oral   Take 1 tablet (50 mg total) by mouth once as needed for migraine. May repeat in 2 hours if headache persists or recurs.   30 tablet   2   . traMADol (ULTRAM) 50 MG tablet   Oral   Take 50 mg by mouth 2 (two) times daily as needed.         . traZODone (DESYREL) 50 MG tablet   Oral   Take 50 mg by mouth at bedtime.           Allergies Neurontin and Watermelon  Family History  Problem Relation Age of Onset  . Breast cancer Mother [id number]  . Breast cancer Sister [id number]    Social  History Social History  Substance Use Topics  . Smoking status: Never Smoker   . Smokeless tobacco: Never Used  . Alcohol Use: No    Review of Systems Constitutional: No fever/chills Eyes: No visual changes. ENT: No sore throat. Cardiovascular: Denies chest pain. Respiratory: Denies shortness of breath. Gastrointestinal: +abdominal pain.  No nausea, no vomiting.  Several loose stools x 2 days.  No constipation. Genitourinary: Negative for dysuria. Musculoskeletal: Negative for back pain. Skin: Negative for rash. Neurological: Negative for headaches, focal weakness or numbness.  10-point ROS otherwise negative.  ____________________________________________   PHYSICAL EXAM:  VITAL SIGNS: ED Triage Vitals  Enc Vitals Group     BP 10/13/15 1625 145/59 mmHg     Pulse Rate 10/13/15 1625 82     Resp 10/13/15 1625 6     Temp 10/13/15 1625 97.8 F (36.6 C)     Temp Source 10/13/15 1625 Oral     SpO2 10/13/15 1625 100 %     Weight 10/13/15 1625 160 lb (72.576 kg)     Height 10/13/15 1625 5\' 2"  (1.575 m)     Head Cir --      Peak Flow --      Pain Score 10/13/15 1628 4     Pain Loc --      Pain Edu? --      Excl. in GC? --     Constitutional: Alert and oriented. Well appearing and in no acute distress. Eyes: Conjunctivae are normal. PERRL. EOMI. Head: Atraumatic. Nose: No congestion/rhinnorhea. Mouth/Throat: Mucous membranes are moist.  Oropharynx non-erythematous. Neck: No stridor.  No meningeal signs.   Cardiovascular: Normal rate, regular rhythm. Good peripheral circulation. Grossly normal heart sounds.   Respiratory: Normal respiratory effort.  No retractions. Lungs CTAB. Gastrointestinal: Soft and nontender even to deep palpation. No distention.  Musculoskeletal: No lower extremity tenderness nor edema. No gross deformities of extremities. Neurologic:  Normal speech and language. No gross focal neurologic deficits are appreciated.  Skin:  Skin is warm, dry and  intact. No rash noted. Psychiatric: Mood and affect are normal. Speech and behavior are normal.  ____________________________________________   LABS (all labs ordered  are listed, but only abnormal results are displayed)  Labs Reviewed  LIPASE, BLOOD - Abnormal; Notable for the following:    Lipase 87 (*)    All other components within normal limits  COMPREHENSIVE METABOLIC PANEL - Abnormal; Notable for the following:    Sodium 132 (*)    Potassium 3.3 (*)    Chloride 94 (*)    Glucose, Bld 214 (*)    All other components within normal limits  CBC - Abnormal; Notable for the following:    MCV 75.6 (*)    MCH 25.5 (*)    All other components within normal limits  URINALYSIS COMPLETEWITH MICROSCOPIC ([facility] ONLY) - Abnormal; Notable for the following:    Color, Urine YELLOW (*)    APPearance CLEAR (*)    Squamous Epithelial / LPF 0-5 (*)    All other components within normal limits   ____________________________________________  EKG  None ____________________________________________  RADIOLOGY   US Abdomen Complete  10/13/2015  CLINICAL DATA:  Episodic right sided abdominal pain for several months. Symptoms are worse recently. EXAM: ABDOMEN ULTRAS   OUND COMPLETE COMPARISON:  CT 06/01/2015 FINDINGS: Gallbladder: Gallbladder wall is 3.9 mm but the gallbladder appears contracted. No definite stones. No pericholecystic fluid or sonographic Murphy's sign. Common bile duct: Diameter: 3.7 mm Liver: The liver is echogenic. There is attenuation of the ultrasound wave, poor visualization of the internal hepatic architecture, and loss of definition of the diaphragm. No focal liver lesions are identified. IVC: No abnormality visualized. Pancreas: Visualized portion unremarkable. Spleen: Size and appearance within normal limits. Right Kidney: Length: 11.1 cm .  Midpole cyst is 1.5 x 1.2 x 1.4 cm. Left Kidney: Length: 11.7 cm. Echogenicity within normal limits. No mass or hydronephrosis  visualized. Abdominal aorta: No aneurysm visualized. Other findings: None. IMPRESSION: 1. Gallbladder is contracted, likely contributing to the apparent gallbladder wall thickening. There is no other evidence for acute cholecystitis. 2. Hepatic steatosis. 3. No focal liver lesions. 4. Right renal cysts. 5. No hydronephrosis. Electronically Signed   By: Norva Pavlov M.D.   On: 10/13/2015 19:20    ____________________________________________   PROCEDURES  Procedure(s) performed:   Procedures   ____________________________________________   INITIAL IMPRESSION / ASSESSMENT AND PLAN / ED COURSE  Pertinent labs & imaging results that were available during my care of the patient were reviewed by me and considered in my medical decision making (see chart for details).  The patient has had extensive workup by outpatient primary care doctor as well as specialist, including 2 CT scans of the abdomen and pelvis and both in upper and lower endoscopy.  I ordered an percent of the complete abdomen for full evaluation and there was nothing acute discovered.  Her gallbladder was contracted so it is hard to fully visualize it but there is no evidence of acute cholecystitis.  Her lipase remains slightly elevated but less so than the last time blood work was done and her signs and symptoms are not at all consistent with pancreatitis.  She has absolutely no tenderness to palpation of the abdomen.  When I ask her about this, she said "of course not, the pain is on the inside.".  I encouraged her to follow up as an outpatient with her GI doctor and primary care provider.   ____________________________________________  FINAL CLINICAL IMPRESSION(S) / ED DIAGNOSES  Final diagnoses:  Chronic abdominal pain     MEDICATIONS GIVEN DURING THIS VISIT:  Medications - No data to display   NEW OUTPATIENT  MEDICATIONS STARTED DURING THIS VISIT:  New Prescriptions   No medications on file      Note:   This document was prepared using Dragon voice recognition software and may include unintentional dictation errors.   Loleta Rose, MD 10/13/15 2006

## 2015-10-13 NOTE — ED Notes (Signed)
Pt arrives to ER via POV c/o right lower quadrant pain X 2 weeks. States intermittent abdominal pain in this same area X months. Pt has had CT, blood and many other tests done. Pt states that the next step her PCP wants her to do is have an endoscopy. Pt alert and oriented X4, active, cooperative, pt in NAD. RR even and unlabored, color WNL.

## 2015-10-13 NOTE — ED Notes (Signed)
Pt in via triage with complaints of RLQ abdominal pain x 2-3 weeks.  Pt reports this has been an ongoing issue x a few months; currently being followed by PCP and GI specialist, pt reports having CT scan, endoscopy, colonoscopy performed but with no definitive answers aside from gastritis.  Pt denies N/V, reports diarrhea x 2 days.  Pt A/Ox4, ambulatory to room, no immediate distress at this time.

## 2015-10-13 NOTE — ED Notes (Signed)
Patient to ultrasound via stretcher with staff. 

## 2015-10-19 ENCOUNTER — Other Ambulatory Visit: Payer: Self-pay | Admitting: Student

## 2015-10-19 DIAGNOSIS — R1011 Right upper quadrant pain: Secondary | ICD-10-CM

## 2015-11-01 ENCOUNTER — Ambulatory Visit
Admission: RE | Admit: 2015-11-01 | Discharge: 2015-11-01 | Disposition: A | Discharge status: Home / Self Care | Source: Ambulatory Visit | Attending: Student | Admitting: Student

## 2015-11-01 DIAGNOSIS — R1011 Right upper quadrant pain: Secondary | ICD-10-CM | POA: Insufficient documentation

## 2015-11-01 MED ORDER — TECHNETIUM TC 99M MEBROFENIN IV KIT
4.8300 | PACK | Freq: Once | INTRAVENOUS | Status: AC | PRN
  Administered 2015-11-01: 4.83 via INTRAVENOUS

## 2016-01-31 ENCOUNTER — Encounter: Payer: Self-pay | Admitting: *Deleted

## 2016-02-07 ENCOUNTER — Encounter
Admission: RE | Disposition: A | Discharge status: Home / Self Care | Payer: Self-pay | Source: Ambulatory Visit | Attending: Ophthalmology

## 2016-02-07 ENCOUNTER — Ambulatory Visit: Admitting: Registered Nurse

## 2016-02-07 ENCOUNTER — Encounter: Payer: Self-pay | Admitting: *Deleted

## 2016-02-07 ENCOUNTER — Ambulatory Visit
Admission: RE | Admit: 2016-02-07 | Discharge: 2016-02-07 | Disposition: A | Discharge status: Home / Self Care | Source: Ambulatory Visit | Attending: Ophthalmology | Admitting: Ophthalmology

## 2016-02-07 DIAGNOSIS — E785 Hyperlipidemia, unspecified: Secondary | ICD-10-CM | POA: Diagnosis not present

## 2016-02-07 DIAGNOSIS — M1990 Unspecified osteoarthritis, unspecified site: Secondary | ICD-10-CM | POA: Insufficient documentation

## 2016-02-07 DIAGNOSIS — E119 Type 2 diabetes mellitus without complications: Secondary | ICD-10-CM | POA: Insufficient documentation

## 2016-02-07 DIAGNOSIS — D649 Anemia, unspecified: Secondary | ICD-10-CM | POA: Insufficient documentation

## 2016-02-07 DIAGNOSIS — I10 Essential (primary) hypertension: Secondary | ICD-10-CM | POA: Diagnosis not present

## 2016-02-07 DIAGNOSIS — Z79899 Other long term (current) drug therapy: Secondary | ICD-10-CM | POA: Insufficient documentation

## 2016-02-07 DIAGNOSIS — H2511 Age-related nuclear cataract, right eye: Secondary | ICD-10-CM | POA: Diagnosis not present

## 2016-02-07 DIAGNOSIS — J309 Allergic rhinitis, unspecified: Secondary | ICD-10-CM | POA: Diagnosis not present

## 2016-02-07 DIAGNOSIS — G43909 Migraine, unspecified, not intractable, without status migrainosus: Secondary | ICD-10-CM | POA: Diagnosis not present

## 2016-02-07 DIAGNOSIS — K219 Gastro-esophageal reflux disease without esophagitis: Secondary | ICD-10-CM | POA: Insufficient documentation

## 2016-02-07 HISTORY — DX: Gastritis, unspecified, without bleeding: K29.70

## 2016-02-07 HISTORY — PX: CATARACT EXTRACTION W/PHACO: SHX586

## 2016-02-07 HISTORY — DX: Anemia, unspecified: D64.9

## 2016-02-07 LAB — GLUCOSE, CAPILLARY: GLUCOSE-CAPILLARY: 277 mg/dL — AB (ref 65–99)

## 2016-02-07 SURGERY — PHACOEMULSIFICATION, CATARACT, WITH IOL INSERTION
Anesthesia: Monitor Anesthesia Care | Site: Eye | Laterality: Right | Wound class: Clean

## 2016-02-07 MED ORDER — ARMC OPHTHALMIC DILATING DROPS
OPHTHALMIC | Status: AC
  Administered 2016-02-07: 1 via OPHTHALMIC
  Filled 2016-02-07: qty 0.4

## 2016-02-07 MED ORDER — FENTANYL CITRATE (PF) 100 MCG/2ML IJ SOLN
INTRAMUSCULAR | Status: DC | PRN
  Administered 2016-02-07: 50 ug via INTRAVENOUS

## 2016-02-07 MED ORDER — MOXIFLOXACIN HCL 0.5 % OP SOLN
OPHTHALMIC | Status: DC | PRN
  Administered 2016-02-07: 9 [drp] via OPHTHALMIC

## 2016-02-07 MED ORDER — POVIDONE-IODINE 5 % OP SOLN
OPHTHALMIC | Status: AC
  Filled 2016-02-07: qty 30

## 2016-02-07 MED ORDER — EPINEPHRINE PF 1 MG/ML IJ SOLN
INTRAMUSCULAR | Status: AC
  Filled 2016-02-07: qty 2

## 2016-02-07 MED ORDER — MIDAZOLAM HCL 2 MG/2ML IJ SOLN
INTRAMUSCULAR | Status: DC | PRN
  Administered 2016-02-07: 1 mg via INTRAVENOUS

## 2016-02-07 MED ORDER — CARBACHOL 0.01 % IO SOLN
INTRAOCULAR | Status: DC | PRN
  Administered 2016-02-07: 0.5 mL via INTRAOCULAR

## 2016-02-07 MED ORDER — NA CHONDROIT SULF-NA HYALURON 40-17 MG/ML IO SOLN
INTRAOCULAR | Status: AC
  Filled 2016-02-07: qty 1

## 2016-02-07 MED ORDER — MOXIFLOXACIN HCL 0.5 % OP SOLN
1.0000 [drp] | OPHTHALMIC | Status: AC
  Administered 2016-02-07 (×3): 1 [drp] via OPHTHALMIC

## 2016-02-07 MED ORDER — SODIUM CHLORIDE 0.9 % IV SOLN
INTRAVENOUS | Status: DC
  Administered 2016-02-07 (×2): via INTRAVENOUS

## 2016-02-07 MED ORDER — LIDOCAINE HCL (PF) 4 % IJ SOLN
INTRAMUSCULAR | Status: AC
  Filled 2016-02-07: qty 5

## 2016-02-07 MED ORDER — EPINEPHRINE PF 1 MG/ML IJ SOLN
INTRAOCULAR | Status: DC | PRN
  Administered 2016-02-07: 250 mL via OPHTHALMIC

## 2016-02-07 MED ORDER — MOXIFLOXACIN HCL 0.5 % OP SOLN
OPHTHALMIC | Status: AC
  Administered 2016-02-07: 1 [drp] via OPHTHALMIC
  Filled 2016-02-07: qty 3

## 2016-02-07 MED ORDER — ARMC OPHTHALMIC DILATING DROPS
1.0000 "application " | OPHTHALMIC | Status: DC
  Administered 2016-02-07 (×3): 1 via OPHTHALMIC

## 2016-02-07 MED ORDER — ONDANSETRON HCL 4 MG/2ML IJ SOLN
INTRAMUSCULAR | Status: DC | PRN
  Administered 2016-02-07: 4 mg via INTRAVENOUS

## 2016-02-07 MED ORDER — LIDOCAINE HCL (PF) 4 % IJ SOLN
INTRAOCULAR | Status: DC | PRN
  Administered 2016-02-07: 4 mL via OPHTHALMIC

## 2016-02-07 MED ORDER — NA CHONDROIT SULF-NA HYALURON 40-17 MG/ML IO SOLN
INTRAOCULAR | Status: DC | PRN
  Administered 2016-02-07: 1 mL via INTRAOCULAR

## 2016-02-07 SURGICAL SUPPLY — 21 items
CANNULA ANT/CHMB 27GA (MISCELLANEOUS) ×3 IMPLANT
CUP MEDICINE 2OZ PLAST GRAD ST (MISCELLANEOUS) ×3 IMPLANT
GLOVE BIO SURGEON STRL SZ8 (GLOVE) ×3 IMPLANT
GLOVE BIOGEL M 6.5 STRL (GLOVE) ×3 IMPLANT
GLOVE SURG LX 8.0 MICRO (GLOVE) ×2
GLOVE SURG LX STRL 8.0 MICRO (GLOVE) ×1 IMPLANT
GOWN STRL REUS W/ TWL LRG LVL3 (GOWN DISPOSABLE) ×2 IMPLANT
GOWN STRL REUS W/TWL LRG LVL3 (GOWN DISPOSABLE) ×4
LENS IOL TECNIS ITEC 23.0 (Intraocular Lens) ×3 IMPLANT
PACK CATARACT (MISCELLANEOUS) ×3 IMPLANT
PACK CATARACT BRASINGTON LX (MISCELLANEOUS) ×3 IMPLANT
PACK EYE AFTER SURG (MISCELLANEOUS) ×3 IMPLANT
SOL BSS BAG (MISCELLANEOUS) ×3
SOL PREP PVP 2OZ (MISCELLANEOUS) ×3
SOLUTION BSS BAG (MISCELLANEOUS) ×1 IMPLANT
SOLUTION PREP PVP 2OZ (MISCELLANEOUS) ×1 IMPLANT
SYR 3ML LL SCALE MARK (SYRINGE) ×3 IMPLANT
SYR 5ML LL (SYRINGE) ×3 IMPLANT
SYR TB 1ML 27GX1/2 LL (SYRINGE) ×3 IMPLANT
WATER STERILE IRR 250ML POUR (IV SOLUTION) ×3 IMPLANT
WIPE NON LINTING 3.25X3.25 (MISCELLANEOUS) ×3 IMPLANT

## 2016-02-07 NOTE — Anesthesia Preprocedure Evaluation (Signed)
Anesthesia Evaluation  Patient identified by MRN, date of birth, ID band Patient awake    Reviewed: Allergy & Precautions, H&P , NPO status , Patient's Chart, lab work & pertinent test results, reviewed documented beta blocker date and time   History of Anesthesia Complications Negative for: history of anesthetic complications  Airway Mallampati: I  TM Distance: >3 FB Neck ROM: full    Dental no notable dental hx. (+) Caps, Teeth Intact, Loose   Pulmonary neg pulmonary ROS,    Pulmonary exam normal breath sounds clear to auscultation       Cardiovascular Exercise Tolerance: Good hypertension, On Medications (-) angina(-) CAD, (-) Past MI, (-) Cardiac Stents and (-) CABG Normal cardiovascular exam+ dysrhythmias (palpitations) (-) Valvular Problems/Murmurs Rhythm:regular Rate:Normal     Neuro/Psych negative neurological ROS  negative psych ROS   GI/Hepatic Neg liver ROS, GERD  ,  Endo/Other  diabetes  Renal/GU negative Renal ROS  negative genitourinary   Musculoskeletal   Abdominal   Peds  Hematology negative hematology ROS (+)   Anesthesia Other Findings Past Medical History:   Hypertension                                                 Diabetes mellitus without complication (HCC)                 Migraines                                                    Hyperlipemia                                                 Arthritis                                                    Reproductive/Obstetrics negative OB ROS                             Anesthesia Physical  Anesthesia Plan  ASA: II  Anesthesia Plan: MAC   Post-op Pain Management:    Induction:   Airway Management Planned:   Additional Equipment:   Intra-op Plan:   Post-operative Plan:   Informed Consent: I have reviewed the patients History and Physical, chart, labs and discussed the procedure including the risks,  benefits and alternatives for the proposed anesthesia with the patient or authorized representative who has indicated his/her understanding and acceptance.   Dental Advisory Given  Plan Discussed with: Anesthesiologist, CRNA and Surgeon  Anesthesia Plan Comments:         Anesthesia Quick Evaluation

## 2016-02-07 NOTE — Discharge Instructions (Signed)
Eye Surgery Discharge Instructions  Expect mild scratchy sensation or mild soreness. DO NOT RUB YOUR EYE!  The day of surgery:  Minimal physical activity, but bed rest is not required  No reading, computer work, or close hand work  No bending, lifting, or straining.  May watch TV  For 24 hours:  No driving, legal decisions, or alcoholic beverages  Safety precautions  Eat anything you prefer: It is better to start with liquids, then soup then solid foods.  _____ Eye patch should be worn until postoperative exam tomorrow.  ____ Solar shield eyeglasses should be worn for comfort in the sunlight/patch while sleeping  Resume all regular medications including aspirin or Coumadin if these were discontinued prior to surgery. You may shower, bathe, shave, or wash your hair. Tylenol may be taken for mild discomfort.  Call your doctor if you experience significant pain, nausea, or vomiting, fever > 101 or other signs of infection. [phone] or [phone] Specific instructions:  Follow-up Information    PORFILIO,WILLIAM LOUIS, MD Follow up on 02/08/2016.   Specialty:  Ophthalmology Why:  10:45 Contact information: [address] [place] [address] [phone]

## 2016-02-07 NOTE — Op Note (Signed)
PREOPERATIVE DIAGNOSIS:  Nuclear sclerotic cataract of the right eye.   POSTOPERATIVE DIAGNOSIS:  Right nuclear sclerotic CATARACT   OPERATIVE PROCEDURE: Procedure(s): CATARACT EXTRACTION PHACO AND INTRAOCULAR LENS PLACEMENT (IOC)   SURGEON:  Galen ManilaWilliam Jameeka Marcy, MD.   ANESTHESIA:  Anesthesiologist: Yves DillPaul Carroll, MD CRNA: Michaele OfferKasey Savage, CRNA; Stormy FabianLinda Curtis, CRNA  1.      Managed anesthesia care. 2.      0.461ml of Shugarcaine was instilled in the eye following the paracentesis.   COMPLICATIONS:  None.   TECHNIQUE:   Stop and chop   DESCRIPTION OF PROCEDURE:  The patient was examined and consented in the preoperative holding area where the aforementioned topical anesthesia was applied to the right eye and then brought back to the Operating Room where the right eye was prepped and draped in the usual sterile ophthalmic fashion and a lid speculum was placed. A paracentesis was created with the side port blade and the anterior chamber was filled with viscoelastic. A near clear corneal incision was performed with the steel keratome. A continuous curvilinear capsulorrhexis was performed with a cystotome followed by the capsulorrhexis forceps. Hydrodissection and hydrodelineation were carried out with BSS on a blunt cannula. The lens was removed in a stop and chop  technique and the remaining cortical material was removed with the irrigation-aspiration handpiece. The capsular bag was inflated with viscoelastic and the Technis [id number]  lens was placed in the capsular bag without complication. The remaining viscoelastic was removed from the eye with the irrigation-aspiration handpiece. The wounds were hydrated. The anterior chamber was flushed with Miostat and the eye was inflated to physiologic pressure. 0.151ml of Vigamox was placed in the anterior chamber. The wounds were found to be water tight. The eye was dressed with Vigamox. The patient was given protective glasses to wear throughout the day and a shield  with which to sleep tonight. The patient was also given drops with which to begin a drop regimen today and will follow-up with me in one day.  Implant Name Type Inv. Item Serial No. Manufacturer Lot No. LRB No. Used  LENS IOL DIOP 23.0 - [id number] Intraocular Lens LENS IOL DIOP 23.0 [id number]   Right 1   Procedure(s) with comments: CATARACT EXTRACTION PHACO AND INTRAOCULAR LENS PLACEMENT (IOC) (Right) -  Lot# [id number] H US: 01:07.9 AP%: 17.4 CDE: 11.86  Electronically signed: Ameliyah Sarno LOUIS 02/07/2016 12:17 PM

## 2016-02-07 NOTE — H&P (Signed)
All labs reviewed. Abnormal studies sent to patients PCP when indicated.  Previous H&P reviewed, patient examined, there are NO CHANGES.  Pamela Heath LOUIS10/17/201711:50 AM

## 2016-02-07 NOTE — Transfer of Care (Signed)
Immediate Anesthesia Transfer of Care Note  Patient: Pamela Heath  Procedure(s) Performed: Procedure(s) with comments: CATARACT EXTRACTION PHACO AND INTRAOCULAR LENS PLACEMENT (IOC) (Right) -  Lot# [id number] H US: 01:07.9 AP%: 17.4 CDE: 11.86  Patient Location: PHASE II  Anesthesia Type:MAC  Level of Consciousness: Awake, Alert, Oriented  Airway & Oxygen Therapy: Patient Spontanous Breathing and Patient on room air   Post-op Assessment: Report given to RN and Post -op Vital signs reviewed and stable  Post vital signs: Reviewed and stable  Last Vitals:  Vitals:   02/07/16 1050 02/07/16 1218  BP: 139/65 134/65  Pulse: 77 77  Resp: 16 16  Temp: 36.8 C 36.8 C    Complications: No apparent anesthesia complications

## 2016-02-07 NOTE — Anesthesia Postprocedure Evaluation (Signed)
Anesthesia Post Note  Patient: Pamela PeeksPattie L Nabers  Procedure(s) Performed: Procedure(s) (LRB): CATARACT EXTRACTION PHACO AND INTRAOCULAR LENS PLACEMENT (IOC) (Right)  Patient location during evaluation: PACU Anesthesia Type: MAC Level of consciousness: awake and alert Pain management: pain level controlled Vital Signs Assessment: post-procedure vital signs reviewed and stable Respiratory status: spontaneous breathing Cardiovascular status: blood pressure returned to baseline Postop Assessment: no headache, no backache and no signs of nausea or vomiting Anesthetic complications: no    Last Vitals:  Vitals:   02/07/16 1050 02/07/16 1218  BP: 139/65 134/65  Pulse: 77 77  Resp: 16 16  Temp: 36.8 C 36.8 C    Last Pain:  Vitals:   02/07/16 1218  TempSrc: Oral                 Starling Mannsurtis,  Breianna Delfino A

## 2016-06-04 ENCOUNTER — Encounter: Payer: Self-pay | Admitting: *Deleted

## 2016-06-04 ENCOUNTER — Emergency Department
Admission: EM | Admit: 2016-06-04 | Discharge: 2016-06-04 | Disposition: A | Discharge status: Home / Self Care | Attending: Emergency Medicine | Admitting: Emergency Medicine

## 2016-06-04 ENCOUNTER — Emergency Department

## 2016-06-04 DIAGNOSIS — Z7982 Long term (current) use of aspirin: Secondary | ICD-10-CM | POA: Diagnosis not present

## 2016-06-04 DIAGNOSIS — E876 Hypokalemia: Secondary | ICD-10-CM | POA: Diagnosis not present

## 2016-06-04 DIAGNOSIS — Z7984 Long term (current) use of oral hypoglycemic drugs: Secondary | ICD-10-CM | POA: Diagnosis not present

## 2016-06-04 DIAGNOSIS — Z79899 Other long term (current) drug therapy: Secondary | ICD-10-CM | POA: Insufficient documentation

## 2016-06-04 DIAGNOSIS — T59811A Toxic effect of smoke, accidental (unintentional), initial encounter: Secondary | ICD-10-CM

## 2016-06-04 DIAGNOSIS — R0789 Other chest pain: Secondary | ICD-10-CM | POA: Diagnosis present

## 2016-06-04 DIAGNOSIS — I10 Essential (primary) hypertension: Secondary | ICD-10-CM | POA: Diagnosis not present

## 2016-06-04 DIAGNOSIS — J705 Respiratory conditions due to smoke inhalation: Secondary | ICD-10-CM | POA: Insufficient documentation

## 2016-06-04 DIAGNOSIS — E119 Type 2 diabetes mellitus without complications: Secondary | ICD-10-CM | POA: Insufficient documentation

## 2016-06-04 LAB — COOXEMETRY PANEL
CARBOXYHEMOGLOBIN: 2.2 % — AB (ref 0.5–1.5)
METHEMOGLOBIN: 1.2 % (ref 0.0–1.5)
O2 SAT: 100 %

## 2016-06-04 LAB — BASIC METABOLIC PANEL
ANION GAP: 11 (ref 5–15)
BUN: 9 mg/dL (ref 6–20)
CHLORIDE: 96 mmol/L — AB (ref 101–111)
CO2: 25 mmol/L (ref 22–32)
Calcium: 9.3 mg/dL (ref 8.9–10.3)
Creatinine, Ser: 0.66 mg/dL (ref 0.44–1.00)
GFR calc Af Amer: >60 mL/min (ref >60)
Glucose, Bld: 205 mg/dL — ABNORMAL HIGH (ref 65–99)
POTASSIUM: 2.8 mmol/L — AB (ref 3.5–5.1)
SODIUM: 132 mmol/L — AB (ref 135–145)

## 2016-06-04 LAB — TROPONIN I: Troponin I: <0.03 ng/mL (ref <0.03)

## 2016-06-04 LAB — CBC
HEMATOCRIT: 39.2 % (ref 35.0–47.0)
HEMOGLOBIN: 13.8 g/dL (ref 12.0–16.0)
MCH: 26.7 pg (ref 26.0–34.0)
MCHC: 35.3 g/dL (ref 32.0–36.0)
MCV: 75.6 fL — AB (ref 80.0–100.0)
Platelets: 261 10*3/uL (ref 150–440)
RBC: 5.18 MIL/uL (ref 3.80–5.20)
RDW: 13 % (ref 11.5–14.5)
WBC: 6.9 10*3/uL (ref 3.6–11.0)

## 2016-06-04 MED ORDER — ASPIRIN 81 MG PO CHEW
324.0000 mg | CHEWABLE_TABLET | Freq: Once | ORAL | Status: AC
  Administered 2016-06-04: 324 mg via ORAL
  Filled 2016-06-04: qty 4

## 2016-06-04 MED ORDER — POTASSIUM CHLORIDE 2 MEQ/ML IV SOLN
30.0000 meq | Freq: Once | INTRAVENOUS | Status: AC
  Administered 2016-06-04: 30 meq via INTRAVENOUS
  Filled 2016-06-04: qty 15

## 2016-06-04 MED ORDER — NITROGLYCERIN 0.4 MG SL SUBL
0.4000 mg | SUBLINGUAL_TABLET | SUBLINGUAL | Status: DC | PRN
  Administered 2016-06-04: 0.4 mg via SUBLINGUAL
  Filled 2016-06-04: qty 1

## 2016-06-04 MED ORDER — POTASSIUM CHLORIDE CRYS ER 20 MEQ PO TBCR
40.0000 meq | EXTENDED_RELEASE_TABLET | Freq: Once | ORAL | Status: AC
  Administered 2016-06-04: 40 meq via ORAL
  Filled 2016-06-04: qty 2

## 2016-06-04 MED ORDER — ALBUTEROL SULFATE (2.5 MG/3ML) 0.083% IN NEBU
5.0000 mg | INHALATION_SOLUTION | Freq: Once | RESPIRATORY_TRACT | Status: AC
  Administered 2016-06-04: 5 mg via RESPIRATORY_TRACT
  Filled 2016-06-04: qty 6

## 2016-06-04 MED ORDER — ALBUTEROL SULFATE HFA 108 (90 BASE) MCG/ACT IN AERS: 2.0000 | INHALATION_SPRAY | Freq: Four times a day (QID) | RESPIRATORY_TRACT | 2 refills | Status: AC | PRN

## 2016-06-04 MED ORDER — SODIUM CHLORIDE 0.9 % IV BOLUS (SEPSIS)
500.0000 mL | Freq: Once | INTRAVENOUS | Status: AC
  Administered 2016-06-04: 500 mL via INTRAVENOUS

## 2016-06-04 MED ORDER — POTASSIUM CHLORIDE CRYS ER 10 MEQ PO TBCR: 10.0000 meq | EXTENDED_RELEASE_TABLET | Freq: Two times a day (BID) | ORAL | 0 refills | Status: DC
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
## 2016-06-04 NOTE — ED Notes (Signed)
MD at bedside. 

## 2016-08-29 ENCOUNTER — Other Ambulatory Visit: Payer: Self-pay | Admitting: Family Medicine

## 2016-08-29 DIAGNOSIS — Z1231 Encounter for screening mammogram for malignant neoplasm of breast: Secondary | ICD-10-CM

## 2016-10-02 ENCOUNTER — Ambulatory Visit
Admission: RE | Admit: 2016-10-02 | Discharge: 2016-10-02 | Disposition: A | Discharge status: Home / Self Care | Source: Ambulatory Visit | Attending: Family Medicine | Admitting: Family Medicine

## 2016-10-02 DIAGNOSIS — Z1231 Encounter for screening mammogram for malignant neoplasm of breast: Secondary | ICD-10-CM | POA: Diagnosis present

## 2016-10-23 ENCOUNTER — Emergency Department
Admission: EM | Admit: 2016-10-23 | Discharge: 2016-10-23 | Disposition: A | Discharge status: Home / Self Care | Attending: Emergency Medicine | Admitting: Emergency Medicine

## 2016-10-23 ENCOUNTER — Emergency Department

## 2016-10-23 ENCOUNTER — Encounter: Payer: Self-pay | Admitting: Emergency Medicine

## 2016-10-23 DIAGNOSIS — I10 Essential (primary) hypertension: Secondary | ICD-10-CM | POA: Diagnosis not present

## 2016-10-23 DIAGNOSIS — E119 Type 2 diabetes mellitus without complications: Secondary | ICD-10-CM | POA: Insufficient documentation

## 2016-10-23 DIAGNOSIS — R079 Chest pain, unspecified: Secondary | ICD-10-CM | POA: Diagnosis present

## 2016-10-23 LAB — BASIC METABOLIC PANEL
ANION GAP: 13 (ref 5–15)
BUN: 9 mg/dL (ref 6–20)
CHLORIDE: 91 mmol/L — AB (ref 101–111)
CO2: 24 mmol/L (ref 22–32)
CREATININE: 0.84 mg/dL (ref 0.44–1.00)
Calcium: 9.6 mg/dL (ref 8.9–10.3)
GFR calc non Af Amer: >60 mL/min (ref >60)
Glucose, Bld: 184 mg/dL — ABNORMAL HIGH (ref 65–99)
POTASSIUM: 3.8 mmol/L (ref 3.5–5.1)
SODIUM: 128 mmol/L — AB (ref 135–145)

## 2016-10-23 LAB — CBC
HCT: 37.8 % (ref 35.0–47.0)
HEMOGLOBIN: 13 g/dL (ref 12.0–16.0)
MCH: 25.7 pg — AB (ref 26.0–34.0)
MCHC: 34.3 g/dL (ref 32.0–36.0)
MCV: 75 fL — AB (ref 80.0–100.0)
PLATELETS: 286 10*3/uL (ref 150–440)
RBC: 5.04 MIL/uL (ref 3.80–5.20)
RDW: 13 % (ref 11.5–14.5)
WBC: 6.3 10*3/uL (ref 3.6–11.0)

## 2016-10-23 LAB — TROPONIN I: Troponin I: <0.03 ng/mL (ref <0.03)

## 2016-10-23 MED ORDER — KETOROLAC TROMETHAMINE 30 MG/ML IJ SOLN
15.0000 mg | Freq: Once | INTRAMUSCULAR | Status: AC
  Administered 2016-10-23: 15 mg via INTRAVENOUS
  Filled 2016-10-23: qty 1

## 2016-10-23 MED ORDER — IOPAMIDOL (ISOVUE-370) INJECTION 76%
75.0000 mL | Freq: Once | INTRAVENOUS | Status: AC | PRN
  Administered 2016-10-23: 75 mL via INTRAVENOUS
  Filled 2016-10-23: qty 75

## 2016-10-23 NOTE — ED Provider Notes (Signed)
[facility] Emergency Department Provider Note       Time seen: ----------------------------------------- 2:44 PM on 10/23/2016 -----------------------------------------     I have reviewed the triage vital signs and the nursing notes.   HISTORY   Chief Complaint Chest Pain    HPI Pamela Heath is a 61 y.o. female who presents to the ED for intermittent chest pain for the last 3 days. Patient describes a stabbing pain center for chest pain comes and goes. She doesn't have any pain at this time, she was seen by her doctor recently and has outpatient appointment scheduled with cardiology. She denies fevers, chills, vomiting or diarrhea. She does report she recently push mowed some of her yard.    Past Medical History:  Diagnosis Date  . Anemia   . Anxiety   . Arthritis   . Diabetes mellitus without complication (HCC)   . Gastritis   . GERD (gastroesophageal reflux disease)   . Hyperlipemia   . Hyperplastic colon polyp 06/10/2015  . Hypertension   . Insomnia   . Migraines     There are no active problems to display for this patient.   Past Surgical History:  Procedure Laterality Date  . BILATERAL CARPAL TUNNEL RELEASE    . CATARACT EXTRACTION W/PHACO Right 02/07/2016   Procedure: CATARACT EXTRACTION PHACO AND INTRAOCULAR LENS PLACEMENT (IOC);  Surgeon: Galen Manila, MD;  Location: [facility] ORS;  Service: Ophthalmology;  Laterality: Right;   Lot# [id number] H [place]: 01:07.9 AP%: 17.4 CDE: 11.86  . COLONOSCOPY WITH PROPOFOL N/A 06/10/2015   Procedure: COLONOSCOPY WITH PROPOFOL;  Surgeon: Elnita Maxwell, MD;  Location[facility] ENDOSCOPY;  Service: Endoscopy;  Laterality: N/A;  . ESOPHAGOGASTRODUODENOSCOPY (EGD) WITH PROPOFOL N/A 08/19/2015   Procedure: ESOPHAGOGASTRODUODENOSCOPY (EGD) WITH PROPOFOL;  Surgeon: Elnita Maxwell, MD;  Location: [facility] ENDOSCOPY;  Service: Endoscopy;  Laterality: N/A;  . HAMMER TOE SURGERY      Allergies Neurontin  [gabapentin] and Watermelon [citrullus vulgaris]  Social History Social History  Substance Use Topics  . Smoking status: Never Smoker  . Smokeless tobacco: Never Used  . Alcohol use No    Review of Systems Constitutional: Negative for fever. Eyes: Negative for vision changes ENT:  Negative for congestion, sore throat Cardiovascular: Positive for chest pain Respiratory: Positive for difficulty breathing with the chest pain Gastrointestinal: Negative for abdominal pain, vomiting and diarrhea. Genitourinary: Negative for dysuria. Musculoskeletal: Negative for back pain. Skin: Negative for rash. Neurological: Negative for headaches, focal weakness or numbness.  All systems negative/normal/unremarkable except as stated in the HPI  ____________________________________________   PHYSICAL EXAM:  VITAL SIGNS: ED Triage Vitals  Enc Vitals Group     BP 10/23/16 1308 (!) 149/80     Pulse Rate 10/23/16 1308 84     Resp 10/23/16 1308 20     Temp 10/23/16 1308 97.9 F (36.6 C)     Temp Source 10/23/16 1308 Oral     SpO2 10/23/16 1308 99 %     Weight 10/23/16 1306 160 lb (72.6 kg)     Height 10/23/16 1306 5\' 2"  (1.575 m)     Head Circumference --      Peak Flow --      Pain Score --      Pain Loc --      Pain Edu? --      Excl. in GC? --     Constitutional: Alert and oriented. Well appearing and in no distress. Eyes: Conjunctivae are normal. Normal extraocular movements.  ENT   Head: Normocephalic and atraumatic.   Nose: No congestion/rhinnorhea.   Mouth/Throat: Mucous membranes are moist.   Neck: No stridor. Cardiovascular: Normal rate, regular rhythm. No murmurs, rubs, or gallops. Respiratory: Normal respiratory effort without tachypnea nor retractions. Breath sounds are clear and equal bilaterally. No wheezes/rales/rhonchi. Gastrointestinal: Soft and nontender. Normal bowel sounds Musculoskeletal: Nontender with normal range of motion in extremities. No lower  extremity tenderness nor edema. Neurologic:  Normal speech and language. No gross focal neurologic deficits are appreciated.  Skin:  Skin is warm, dry and intact. No rash noted. Psychiatric: Mood and affect are normal. Speech and behavior are normal.  ____________________________________________  EKG: Interpreted by me.Sinus rhythm rate 80 bpm, normal PR interval, normal QRS, normal QT.  ____________________________________________  ED COURSE:  Pertinent labs & imaging results that were available during my care of the patient were reviewed by me and considered in my medical decision making (see chart for details). Patient presents for chest pain, we will assess with labs and imaging as indicated.   Procedures ____________________________________________   LABS (pertinent positives/negatives)  Labs Reviewed  BASIC METABOLIC PANEL - Abnormal; Notable for the following:       Result Value   Sodium 128 (*)    Chloride 91 (*)    Glucose, Bld 184 (*)    All other components within normal limits  CBC - Abnormal; Notable for the following:    MCV 75.0 (*)    MCH 25.7 (*)    All other components within normal limits  TROPONIN I  TROPONIN I    RADIOLOGY Images were viewed by me  Chest x-ray is unremarkable CT angiogram of the chest IMPRESSION: 1. Negative for acute PE or thoracic aortic dissection. 2. Single sub solid left lower lobe nodular opacity, possibly infectious/inflammatory but nonspecific. Initial follow-up with CT at 6-12 months is recommended to confirm resolution. If persistent, repeat CT is recommended every 2 years until 5 years of stability has been established. This recommendation follows the consensus statement: Guidelines for Management of Incidental Pulmonary Nodules Detected on CT Images: From the Fleischner Society 2017; Radiology 2017; [id number]. ____________________________________________  FINAL ASSESSMENT AND PLAN  Chest pain  Plan: Patient's  labs and imaging were dictated above. Patient had presented for Chest pain of uncertain etiology. She is advised to continue outpatient follow-up with cardiology as scheduled. I will ensure that she is taking aspirin daily, pain is likely musculoskeletal in origin. She was advised of her pulmonary nodule seen on CT.   Emily FilbertWilliams, Santoria Chason E, MD   Note: This note was generated in part or whole with voice recognition software. Voice recognition is usually quite accurate but there are transcription errors that can and very often do occur. I apologize for any typographical errors that were not detected and corrected.     Emily FilbertWilliams, Hever Castilleja E, MD 10/23/16 1536

## 2016-10-23 NOTE — ED Triage Notes (Signed)
Pt c/o central chest pain X 3 days that is intermittent.  No pain at this time.  Scheduled for dr Juliann Parescallwood Thursday but came in today to get checked. Respirations unlabored. NAD. VSS.  Has NTG but has not taken any.

## 2017-09-02 ENCOUNTER — Other Ambulatory Visit: Payer: Self-pay | Admitting: Family Medicine

## 2017-09-02 DIAGNOSIS — Z1231 Encounter for screening mammogram for malignant neoplasm of breast: Secondary | ICD-10-CM

## 2017-11-06 ENCOUNTER — Ambulatory Visit
Admission: RE | Admit: 2017-11-06 | Discharge: 2017-11-06 | Disposition: A | Discharge status: Home / Self Care | Source: Ambulatory Visit | Attending: Family Medicine | Admitting: Family Medicine

## 2017-11-06 DIAGNOSIS — Z1231 Encounter for screening mammogram for malignant neoplasm of breast: Secondary | ICD-10-CM | POA: Diagnosis not present

## 2017-11-13 ENCOUNTER — Other Ambulatory Visit: Payer: Self-pay | Admitting: Family Medicine

## 2017-11-13 DIAGNOSIS — R911 Solitary pulmonary nodule: Secondary | ICD-10-CM

## 2017-11-26 ENCOUNTER — Ambulatory Visit
Admission: RE | Admit: 2017-11-26 | Discharge: 2017-11-26 | Disposition: A | Discharge status: Home / Self Care | Source: Ambulatory Visit | Attending: Family Medicine | Admitting: Family Medicine

## 2017-11-26 DIAGNOSIS — R911 Solitary pulmonary nodule: Secondary | ICD-10-CM

## 2017-11-26 DIAGNOSIS — I2510 Atherosclerotic heart disease of native coronary artery without angina pectoris: Secondary | ICD-10-CM | POA: Insufficient documentation

## 2018-10-20 ENCOUNTER — Other Ambulatory Visit: Payer: Self-pay | Admitting: Family Medicine

## 2018-10-20 DIAGNOSIS — Z1231 Encounter for screening mammogram for malignant neoplasm of breast: Secondary | ICD-10-CM

## 2018-11-25 ENCOUNTER — Ambulatory Visit
Admission: RE | Admit: 2018-11-25 | Discharge: 2018-11-25 | Disposition: A | Discharge status: Home / Self Care | Source: Ambulatory Visit | Attending: Family Medicine | Admitting: Family Medicine

## 2018-11-25 ENCOUNTER — Other Ambulatory Visit: Payer: Self-pay

## 2018-11-25 DIAGNOSIS — Z1231 Encounter for screening mammogram for malignant neoplasm of breast: Secondary | ICD-10-CM | POA: Diagnosis not present

## 2018-12-03 ENCOUNTER — Other Ambulatory Visit: Payer: Self-pay | Admitting: Family Medicine

## 2018-12-03 DIAGNOSIS — R928 Other abnormal and inconclusive findings on diagnostic imaging of breast: Secondary | ICD-10-CM

## 2018-12-03 DIAGNOSIS — N6310 Unspecified lump in the right breast, unspecified quadrant: Secondary | ICD-10-CM

## 2018-12-08 ENCOUNTER — Other Ambulatory Visit: Payer: Self-pay

## 2018-12-08 ENCOUNTER — Ambulatory Visit
Admission: RE | Admit: 2018-12-08 | Discharge: 2018-12-08 | Disposition: A | Discharge status: Home / Self Care | Source: Ambulatory Visit | Attending: Family Medicine | Admitting: Family Medicine

## 2018-12-08 DIAGNOSIS — R928 Other abnormal and inconclusive findings on diagnostic imaging of breast: Secondary | ICD-10-CM | POA: Insufficient documentation

## 2018-12-08 DIAGNOSIS — N6310 Unspecified lump in the right breast, unspecified quadrant: Secondary | ICD-10-CM | POA: Diagnosis present

## 2019-02-02 IMAGING — MG MM DIGITAL SCREENING BILAT W/ CAD
4 series · 4 of 4 positions shown · non-contrast
Comparison: Previous exam(s).

CLINICAL DATA: Screening.

EXAM:
DIGITAL SCREENING BILATERAL MAMMOGRAM WITH CAD

[L MLO]
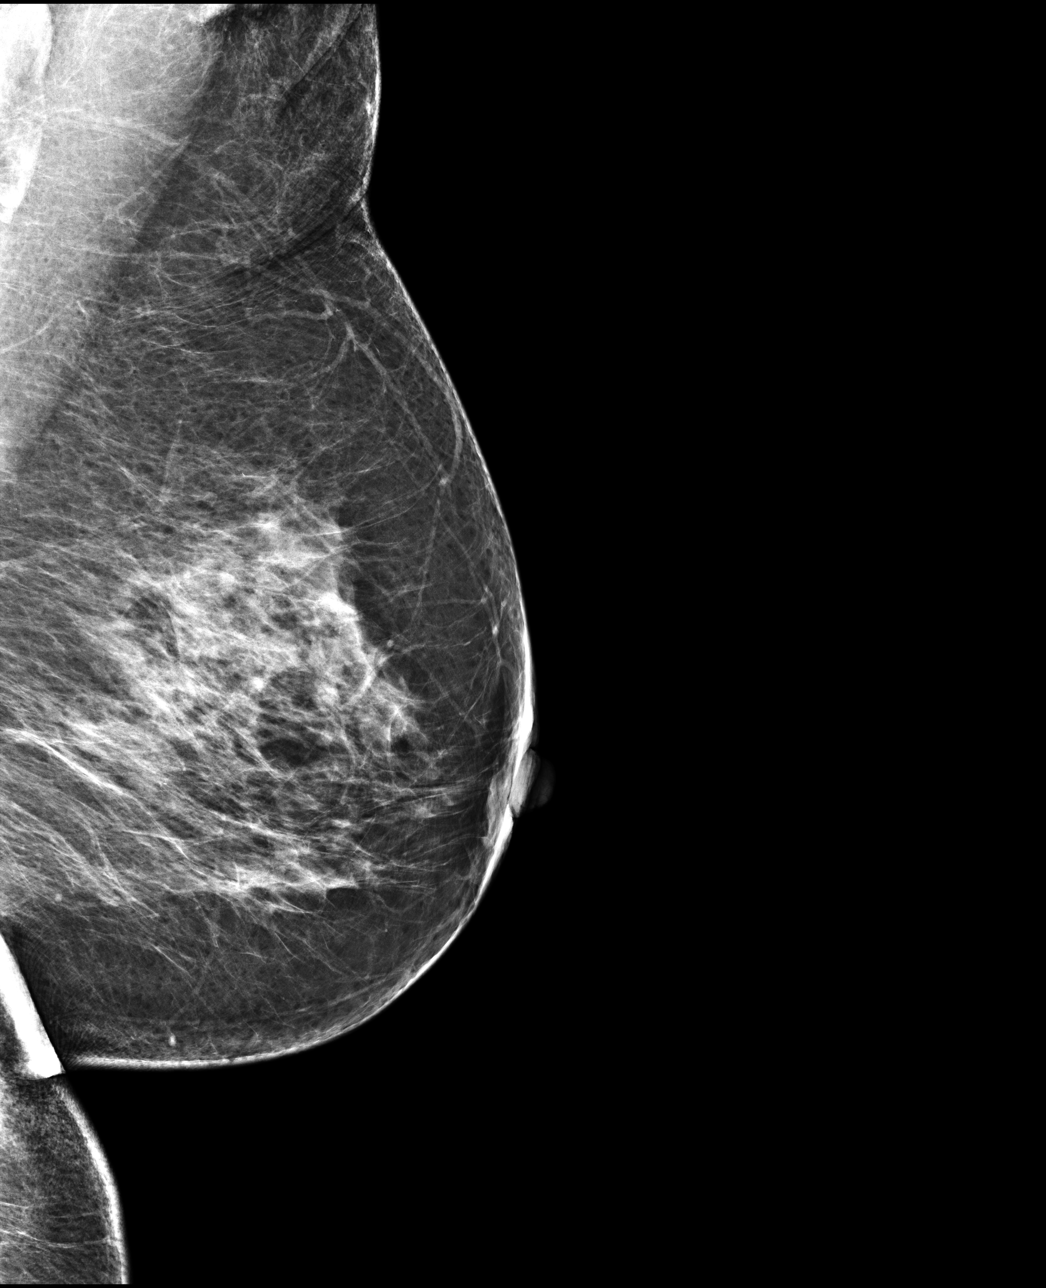

[R MLO]
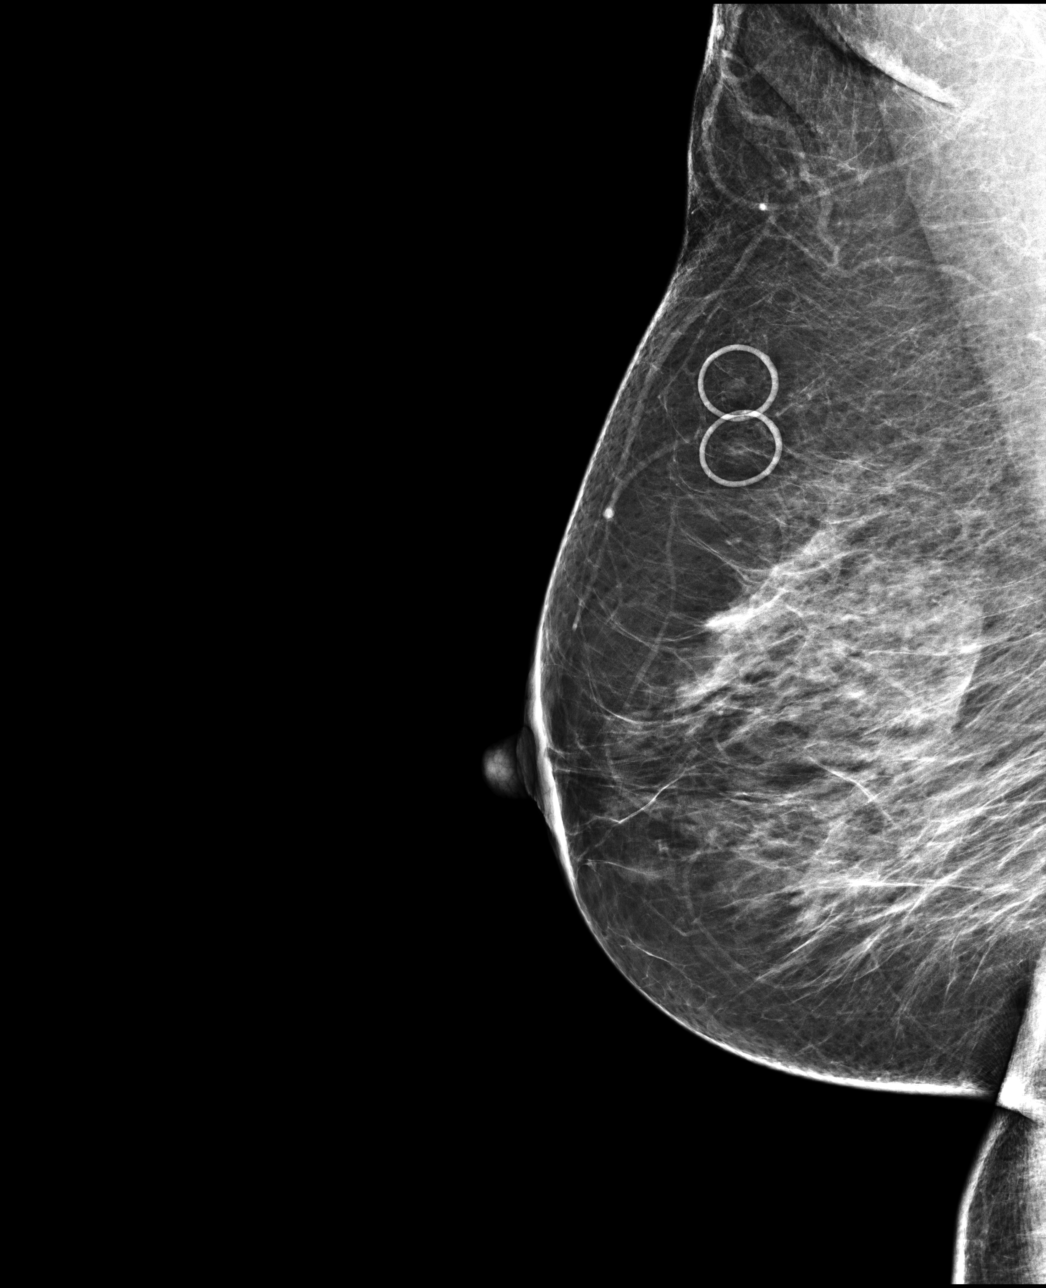

[L CC]
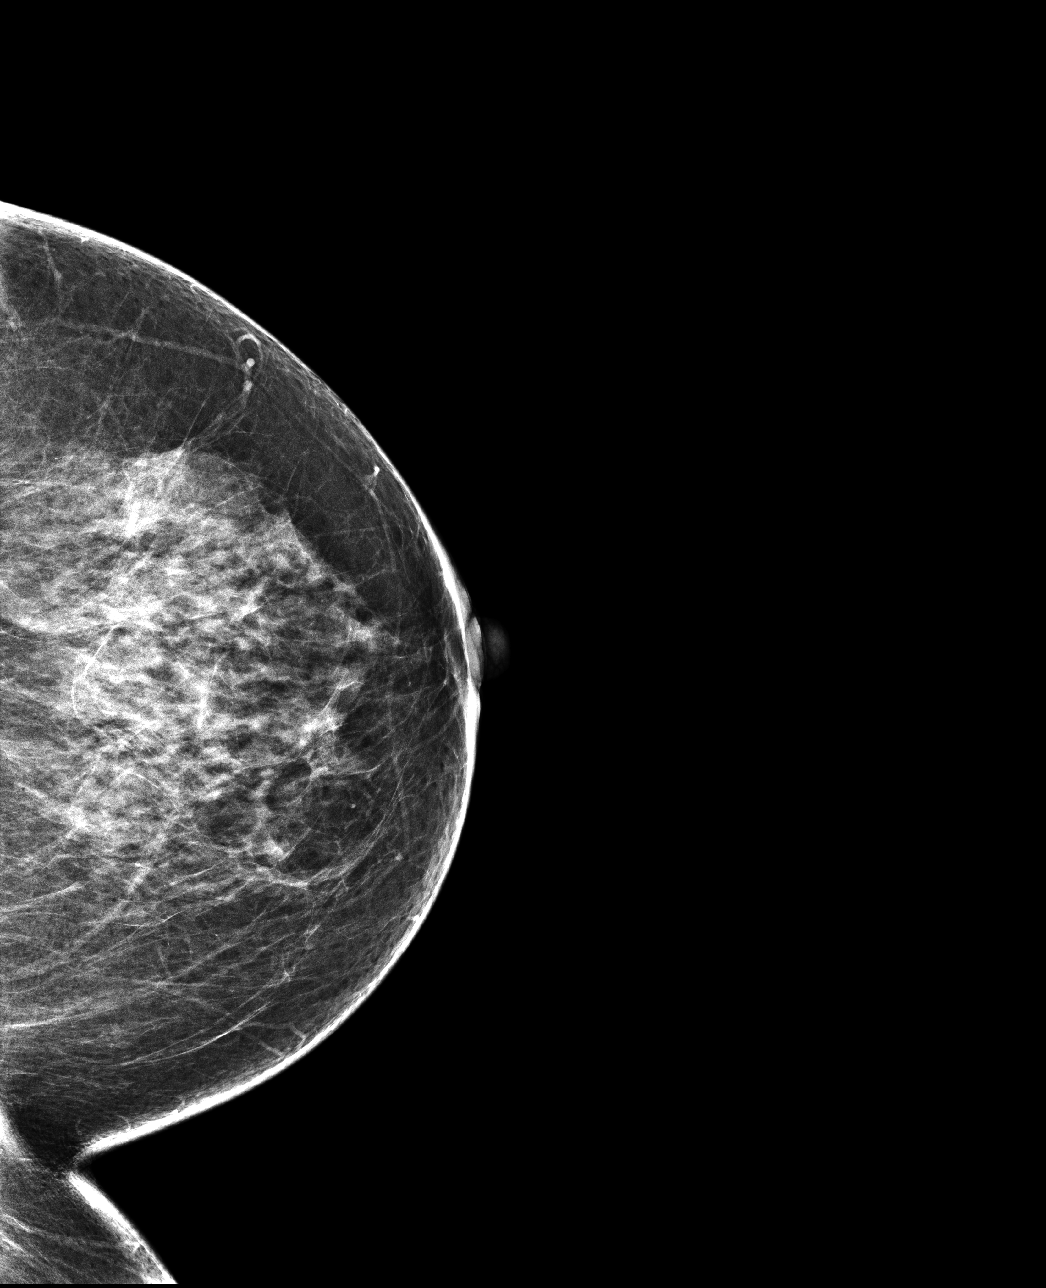

[R CC]
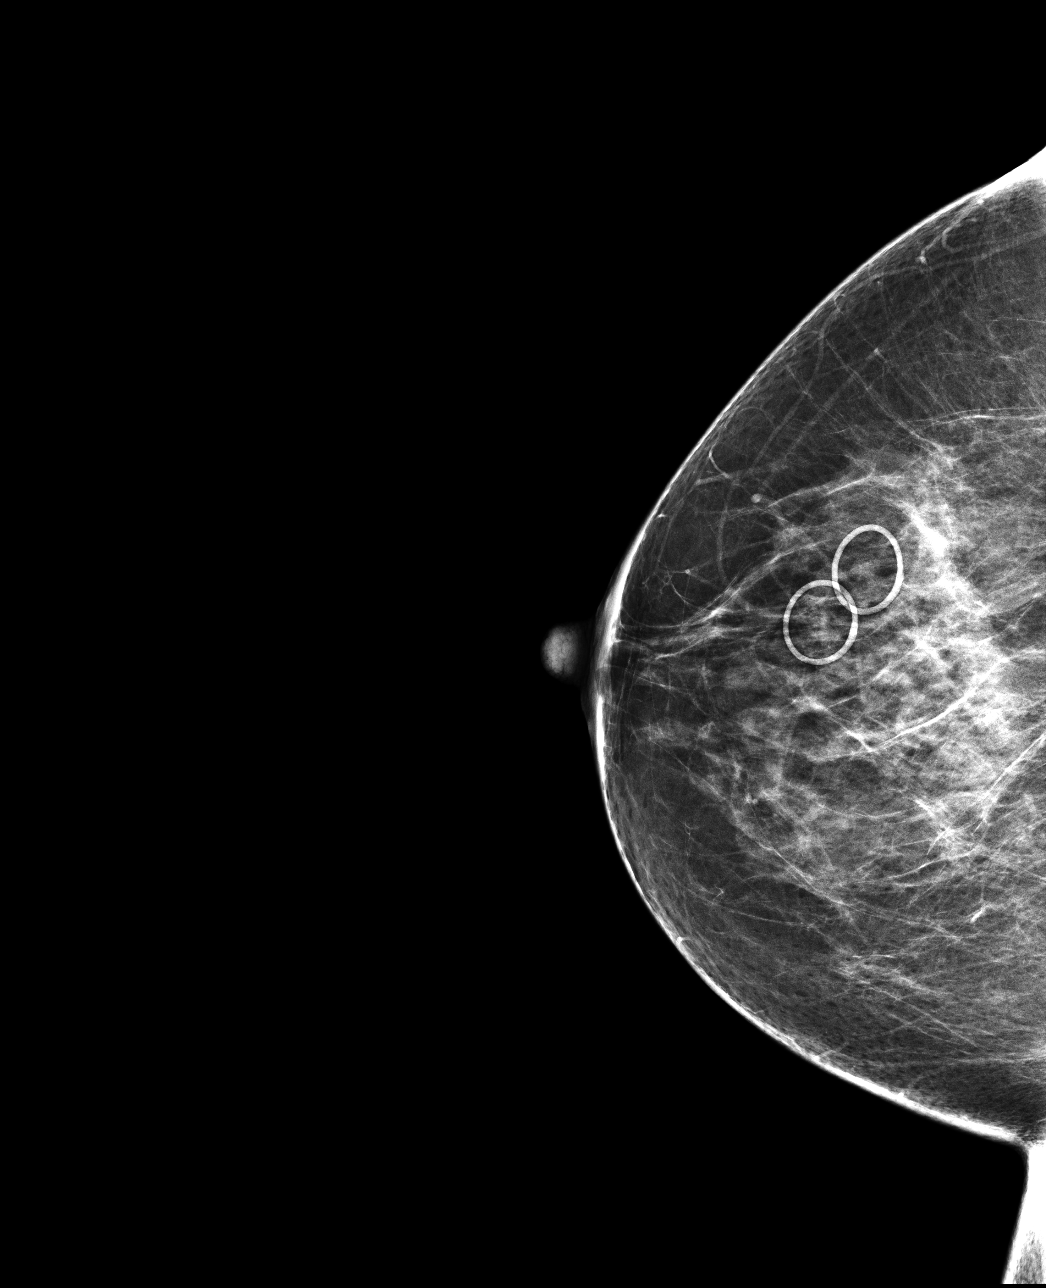

[4 of 4 positions shown; findings below may reference images not displayed]

ACR Breast Density Category c: The breast tissue is heterogeneously
dense, which may obscure small masses.
FINDINGS: There are no findings suspicious for malignancy. Images were
processed with CAD.
IMPRESSION: No mammographic evidence of malignancy. A result letter of this
screening mammogram will be mailed directly to the patient.

RECOMMENDATION:
Screening mammogram in one year. (Code:YJ-[id number])

BI-RADS CATEGORY  1: Negative.

## 2019-08-17 ENCOUNTER — Emergency Department
Admission: EM | Admit: 2019-08-17 | Discharge: 2019-08-17 | Disposition: A | Discharge status: Home / Self Care | Attending: Emergency Medicine | Admitting: Emergency Medicine

## 2019-08-17 ENCOUNTER — Encounter: Payer: Self-pay | Admitting: Emergency Medicine

## 2019-08-17 ENCOUNTER — Emergency Department

## 2019-08-17 ENCOUNTER — Other Ambulatory Visit: Payer: Self-pay

## 2019-08-17 DIAGNOSIS — Z79899 Other long term (current) drug therapy: Secondary | ICD-10-CM | POA: Diagnosis not present

## 2019-08-17 DIAGNOSIS — Y999 Unspecified external cause status: Secondary | ICD-10-CM | POA: Insufficient documentation

## 2019-08-17 DIAGNOSIS — M545 Low back pain: Secondary | ICD-10-CM | POA: Diagnosis present

## 2019-08-17 DIAGNOSIS — Z7984 Long term (current) use of oral hypoglycemic drugs: Secondary | ICD-10-CM | POA: Insufficient documentation

## 2019-08-17 DIAGNOSIS — Z7982 Long term (current) use of aspirin: Secondary | ICD-10-CM | POA: Insufficient documentation

## 2019-08-17 DIAGNOSIS — Y92410 Unspecified street and highway as the place of occurrence of the external cause: Secondary | ICD-10-CM | POA: Diagnosis not present

## 2019-08-17 DIAGNOSIS — I10 Essential (primary) hypertension: Secondary | ICD-10-CM | POA: Insufficient documentation

## 2019-08-17 DIAGNOSIS — Y93I9 Activity, other involving external motion: Secondary | ICD-10-CM | POA: Insufficient documentation

## 2019-08-17 DIAGNOSIS — E119 Type 2 diabetes mellitus without complications: Secondary | ICD-10-CM | POA: Insufficient documentation

## 2019-08-17 MED ORDER — TRAMADOL HCL 50 MG PO TABS
50.0000 mg | ORAL_TABLET | Freq: Four times a day (QID) | ORAL | 0 refills | Status: AC | PRN
Start: 2019-08-17 — End: 2019-08-20
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
## 2020-01-25 ENCOUNTER — Other Ambulatory Visit: Payer: Self-pay | Admitting: Family Medicine

## 2020-01-25 DIAGNOSIS — Z1231 Encounter for screening mammogram for malignant neoplasm of breast: Secondary | ICD-10-CM

## 2020-02-16 ENCOUNTER — Ambulatory Visit
Admission: RE | Admit: 2020-02-16 | Discharge: 2020-02-16 | Disposition: A | Discharge status: Home / Self Care | Source: Ambulatory Visit | Attending: Family Medicine | Admitting: Family Medicine

## 2020-02-16 ENCOUNTER — Other Ambulatory Visit: Payer: Self-pay

## 2020-02-16 DIAGNOSIS — Z1231 Encounter for screening mammogram for malignant neoplasm of breast: Secondary | ICD-10-CM | POA: Insufficient documentation

## 2021-01-16 ENCOUNTER — Other Ambulatory Visit: Payer: Self-pay | Admitting: Family Medicine

## 2021-01-16 DIAGNOSIS — Z1231 Encounter for screening mammogram for malignant neoplasm of breast: Secondary | ICD-10-CM

## 2021-02-16 ENCOUNTER — Ambulatory Visit
Admission: RE | Admit: 2021-02-16 | Discharge: 2021-02-16 | Disposition: A | Discharge status: Home / Self Care | Payer: Medicare Other | Source: Ambulatory Visit | Attending: Family Medicine | Admitting: Family Medicine

## 2021-02-16 ENCOUNTER — Other Ambulatory Visit: Payer: Self-pay

## 2021-02-16 DIAGNOSIS — Z1231 Encounter for screening mammogram for malignant neoplasm of breast: Secondary | ICD-10-CM | POA: Insufficient documentation

## 2022-01-09 ENCOUNTER — Other Ambulatory Visit: Payer: Self-pay | Admitting: Family Medicine

## 2022-01-09 DIAGNOSIS — Z1231 Encounter for screening mammogram for malignant neoplasm of breast: Secondary | ICD-10-CM

## 2022-02-19 ENCOUNTER — Ambulatory Visit
Admission: RE | Admit: 2022-02-19 | Discharge: 2022-02-19 | Disposition: A | Discharge status: Home / Self Care | Payer: Medicare Other | Source: Ambulatory Visit | Attending: Family Medicine | Admitting: Family Medicine

## 2022-02-19 DIAGNOSIS — Z1231 Encounter for screening mammogram for malignant neoplasm of breast: Secondary | ICD-10-CM | POA: Diagnosis not present

## 2022-05-24 ENCOUNTER — Emergency Department: Payer: Medicare Other

## 2022-05-24 ENCOUNTER — Other Ambulatory Visit: Payer: Self-pay

## 2022-05-24 ENCOUNTER — Emergency Department
Admission: EM | Admit: 2022-05-24 | Discharge: 2022-05-24 | Disposition: A | Discharge status: Home / Self Care | Payer: Medicare Other | Attending: Emergency Medicine | Admitting: Emergency Medicine

## 2022-05-24 DIAGNOSIS — R21 Rash and other nonspecific skin eruption: Secondary | ICD-10-CM | POA: Insufficient documentation

## 2022-05-24 DIAGNOSIS — E1165 Type 2 diabetes mellitus with hyperglycemia: Secondary | ICD-10-CM | POA: Insufficient documentation

## 2022-05-24 DIAGNOSIS — D509 Iron deficiency anemia, unspecified: Secondary | ICD-10-CM | POA: Diagnosis not present

## 2022-05-24 DIAGNOSIS — E871 Hypo-osmolality and hyponatremia: Secondary | ICD-10-CM | POA: Diagnosis not present

## 2022-05-24 DIAGNOSIS — R0602 Shortness of breath: Secondary | ICD-10-CM | POA: Diagnosis present

## 2022-05-24 DIAGNOSIS — U071 COVID-19: Secondary | ICD-10-CM

## 2022-05-24 DIAGNOSIS — I10 Essential (primary) hypertension: Secondary | ICD-10-CM | POA: Diagnosis not present

## 2022-05-24 LAB — CBC
HCT: 30.5 % — ABNORMAL LOW (ref 36.0–46.0)
Hemoglobin: 9 g/dL — ABNORMAL LOW (ref 12.0–15.0)
MCH: 19.5 pg — ABNORMAL LOW (ref 26.0–34.0)
MCHC: 29.5 g/dL — ABNORMAL LOW (ref 30.0–36.0)
MCV: 66.2 fL — ABNORMAL LOW (ref 80.0–100.0)
Platelets: 408 10*3/uL — ABNORMAL HIGH (ref 150–400)
RBC: 4.61 MIL/uL (ref 3.87–5.11)
RDW: 18.4 % — ABNORMAL HIGH (ref 11.5–15.5)
WBC: 6.3 10*3/uL (ref 4.0–10.5)
nRBC: 0 % (ref 0.0–0.2)

## 2022-05-24 LAB — COMPREHENSIVE METABOLIC PANEL
ALT: 16 U/L (ref 0–44)
AST: 24 U/L (ref 15–41)
Albumin: 4.2 g/dL (ref 3.5–5.0)
Alkaline Phosphatase: 52 U/L (ref 38–126)
Anion gap: 12 (ref 5–15)
BUN: 9 mg/dL (ref 8–23)
CO2: 23 mmol/L (ref 22–32)
Calcium: 8.7 mg/dL — ABNORMAL LOW (ref 8.9–10.3)
Chloride: 95 mmol/L — ABNORMAL LOW (ref 98–111)
Creatinine, Ser: 0.61 mg/dL (ref 0.44–1.00)
GFR, Estimated: >60 mL/min (ref >60)
Glucose, Bld: 174 mg/dL — ABNORMAL HIGH (ref 70–99)
Potassium: 3.5 mmol/L (ref 3.5–5.1)
Sodium: 130 mmol/L — ABNORMAL LOW (ref 135–145)
Total Bilirubin: 0.6 mg/dL (ref 0.3–1.2)
Total Protein: 7 g/dL (ref 6.5–8.1)

## 2022-05-24 LAB — RESP PANEL BY RT-PCR (RSV, FLU A&B, COVID)  RVPGX2
Influenza A by PCR: NEGATIVE
Influenza B by PCR: NEGATIVE
Resp Syncytial Virus by PCR: NEGATIVE
SARS Coronavirus 2 by RT PCR: POSITIVE — AB

## 2022-05-24 MED ORDER — FERROUS SULFATE 325 (65 FE) MG PO TABS: 325.0000 mg | ORAL_TABLET | Freq: Two times a day (BID) | ORAL | 0 refills | Status: AC

## 2022-05-24 MED ORDER — IOHEXOL 350 MG/ML SOLN
75.0000 mL | Freq: Once | INTRAVENOUS | Status: AC | PRN
  Administered 2022-05-24: 75 mL via INTRAVENOUS

## 2022-05-24 MED ORDER — DEXAMETHASONE SODIUM PHOSPHATE 10 MG/ML IJ SOLN
10.0000 mg | Freq: Once | INTRAMUSCULAR | Status: AC
  Administered 2022-05-24: 10 mg via INTRAVENOUS
  Filled 2022-05-24: qty 1

## 2022-05-24 NOTE — ED Provider Notes (Signed)
[facility] Provider Note    Event Date/Time   First MD Initiated Contact with Patient 05/24/22 1330     (approximate)   History   Shortness of Breath   HPI  Pamela Heath is a 67 y.o. female  with history of hypertension, GERD, diabetes, IDA and as listed in EMR presents to the emergency department for evaluation of rash and shortness of breath. She states she was diagnosed with pneumonia 2 months ago and treated with antibiotics. She then developed a diffuse body rash and was told by dermatology it was "viral" and treated her for shingles. Rash is from trunk to lower extremities, but not on face, palms, or soles of feet. It is pruritic in nature and at onset was occasionally painful. No improvement with acyclovir. Shortness of breath and dry cough started a couple of days ago. No chest pain, fever, nausea, vomiting, diarrhea.      Physical Exam   Triage Vital Signs: ED Triage Vitals  Enc Vitals Group     BP 05/24/22 1256 (!) 152/53     Pulse Rate 05/24/22 1256 91     Resp 05/24/22 1256 16     Temp 05/24/22 1256 97.7 F (36.5 C)     Temp Source 05/24/22 1256 Oral     SpO2 05/24/22 1256 99 %     Weight 05/24/22 1253 160 lb 0.9 oz (72.6 kg)     Height 05/24/22 1253 5\' 2"  (1.575 m)     Head Circumference --      Peak Flow --      Pain Score 05/24/22 1252 0     Pain Loc --      Pain Edu? --      Excl. in [place]? --     Most recent vital signs: Vitals:   05/24/22 1256  BP: (!) 152/53  Pulse: 91  Resp: 16  Temp: 97.7 F (36.5 C)  SpO2: 99%    General: Awake, no distress.  CV:  Good peripheral perfusion.  Resp:  Normal effort.  Abd:  No distention.  Other:  Diffuse, annular, erythematous, maculopapular rash over back and extremities.   ED Results / Procedures / Treatments   Labs (all labs ordered are listed, but only abnormal results are displayed) Labs Reviewed  RESP PANEL BY RT-PCR (RSV, FLU A&B, COVID)  RVPGX2 - Abnormal; Notable  for the following components:      Result Value   SARS Coronavirus 2 by RT PCR POSITIVE (*)    All other components within normal limits  CBC - Abnormal; Notable for the following components:   Hemoglobin 9.0 (*)    HCT 30.5 (*)    MCV 66.2 (*)    MCH 19.5 (*)    MCHC 29.5 (*)    RDW 18.4 (*)    Platelets 408 (*)    All other components within normal limits  COMPREHENSIVE METABOLIC PANEL - Abnormal; Notable for the following components:   Sodium 130 (*)    Chloride 95 (*)    Glucose, Bld 174 (*)    Calcium 8.7 (*)    All other components within normal limits     EKG  Normal sinus rhythm, ventricular rate of 87.  No significant change in comparison to previous.  RADIOLOGY  Image and radiology report reviewed and interpreted by me. Radiology report consistent with the same.  Chest x-ray shows nodular opacity at the left lung base, otherwise no acute concerns.  PROCEDURES:  Critical Care performed: No  Procedures   MEDICATIONS ORDERED IN ED:  Medications  dexamethasone (DECADRON) injection 10 mg (10 mg Intravenous Given 05/24/22 1447)  iohexol (OMNIPAQUE) 350 MG/ML injection 75 mL (75 mLs Intravenous Contrast Given 05/24/22 1509)     IMPRESSION / MDM / ASSESSMENT AND PLAN / ED COURSE   I have reviewed the triage note.  Differential diagnosis includes, but is not limited to, Covid, pneumonia, bronchitis, cardiac event. Contact dermatitis, viral rash.  Patient's presentation is most consistent with acute presentation with potential threat to life or bodily function.  67 year old female presenting to the emergency department for treatment and evaluation of shortness of breath and rash.  See HPI for further details.  COVID is positive.  CBC shows a hemoglobin of 9.0 with a hematocrit of 30.5.  Platelet count is normal.  Glucose is 174 with a reflective sodium of 130.  Anion gap is normal.  Kidney function is normal.  EKG shows a normal sinus rhythm.  Chest x-ray shows an  opacity at the left lower lobe with radiologist recommendation of following up with a CT scan.  Results discussed with the patient.  She does report a history of iron deficiency anemia for which she used to take iron supplements.  She has not taken these in several years.  She has not noted any blood in her stools.  Hemoccult is negative.  While here, patient was given Decadron IV.  Hopefully this will help with both her rash as well as shortness of breath secondary to COVID-19.  CT of the chest does not show the opacity identified in the initial chest x-ray.  She has a stable pulmonary nodule.  Vital signs are stable including respiratory rate and oxygen saturation.  Plan will be to discharge her home with a renewed prescription for her iron supplements.  She has an appointment scheduled with her primary care provider on the 19th.  She was encouraged to keep this appointment or be evaluated sooner if her symptoms or not improving.  ER return precautions discussed as well.      FINAL CLINICAL IMPRESSION(S) / ED DIAGNOSES   Final diagnoses:  Iron deficiency anemia, unspecified iron deficiency anemia type  COVID  Rash and nonspecific skin eruption     Rx / DC Orders   ED Discharge Orders          Ordered    ferrous sulfate 325 (65 FE) MG tablet  2 times daily        05/24/22 1554             Note:  This document was prepared using Dragon voice recognition software and may include unintentional dictation errors.   Victorino Dike, FNP 05/24/22 1629    Vanessa [place], MD 05/24/22 [phone]

## 2022-05-24 NOTE — ED Triage Notes (Addendum)
Pt here with SOB. Pt states she was just dx with shingles 2 weeks ago. Pt states she was also told she had pneumonia and was given abx. Pt states she is struggling to catch her breath. Pt states she was given acyclovir and oxycodone for her pain.

## 2022-07-09 ENCOUNTER — Other Ambulatory Visit: Payer: Self-pay | Admitting: Family Medicine

## 2022-07-09 DIAGNOSIS — Z780 Asymptomatic menopausal state: Secondary | ICD-10-CM

## 2022-07-23 ENCOUNTER — Ambulatory Visit
Admission: RE | Admit: 2022-07-23 | Discharge: 2022-07-23 | Disposition: A | Discharge status: Home / Self Care | Payer: Medicare Other | Source: Ambulatory Visit | Attending: Family Medicine | Admitting: Family Medicine

## 2022-07-23 DIAGNOSIS — Z780 Asymptomatic menopausal state: Secondary | ICD-10-CM | POA: Insufficient documentation

## 2023-01-14 ENCOUNTER — Other Ambulatory Visit: Payer: Self-pay | Admitting: Family Medicine

## 2023-01-14 DIAGNOSIS — Z1231 Encounter for screening mammogram for malignant neoplasm of breast: Secondary | ICD-10-CM

## 2023-02-12 ENCOUNTER — Observation Stay
Admission: EM | Admit: 2023-02-12 | Discharge: 2023-02-13 | Disposition: A | Discharge status: Home / Self Care | Payer: Medicare Other | Attending: Student | Admitting: Student

## 2023-02-12 ENCOUNTER — Observation Stay: Payer: Medicare Other

## 2023-02-12 ENCOUNTER — Emergency Department: Payer: Medicare Other

## 2023-02-12 DIAGNOSIS — H538 Other visual disturbances: Secondary | ICD-10-CM | POA: Insufficient documentation

## 2023-02-12 DIAGNOSIS — I10 Essential (primary) hypertension: Secondary | ICD-10-CM | POA: Diagnosis present

## 2023-02-12 DIAGNOSIS — Z7984 Long term (current) use of oral hypoglycemic drugs: Secondary | ICD-10-CM | POA: Insufficient documentation

## 2023-02-12 DIAGNOSIS — E785 Hyperlipidemia, unspecified: Secondary | ICD-10-CM | POA: Diagnosis present

## 2023-02-12 DIAGNOSIS — R4781 Slurred speech: Secondary | ICD-10-CM | POA: Insufficient documentation

## 2023-02-12 DIAGNOSIS — Z7982 Long term (current) use of aspirin: Secondary | ICD-10-CM | POA: Diagnosis not present

## 2023-02-12 DIAGNOSIS — H5461 Unqualified visual loss, right eye, normal vision left eye: Secondary | ICD-10-CM | POA: Diagnosis present

## 2023-02-12 DIAGNOSIS — G459 Transient cerebral ischemic attack, unspecified: Principal | ICD-10-CM | POA: Diagnosis present

## 2023-02-12 DIAGNOSIS — E119 Type 2 diabetes mellitus without complications: Secondary | ICD-10-CM | POA: Diagnosis not present

## 2023-02-12 DIAGNOSIS — R29810 Facial weakness: Secondary | ICD-10-CM | POA: Diagnosis not present

## 2023-02-12 LAB — CBG MONITORING, ED
Glucose-Capillary: 208 mg/dL — ABNORMAL HIGH (ref 70–99)
Glucose-Capillary: 65 mg/dL — ABNORMAL LOW (ref 70–99)
Glucose-Capillary: 66 mg/dL — ABNORMAL LOW (ref 70–99)
Glucose-Capillary: 173 mg/dL — ABNORMAL HIGH (ref 70–99)

## 2023-02-12 LAB — COMPREHENSIVE METABOLIC PANEL
ALT: 18 U/L (ref 0–44)
AST: 22 U/L (ref 15–41)
Albumin: 4.4 g/dL (ref 3.5–5.0)
Alkaline Phosphatase: 59 U/L (ref 38–126)
Anion gap: 13 (ref 5–15)
BUN: 14 mg/dL (ref 8–23)
CO2: 25 mmol/L (ref 22–32)
Calcium: 9.5 mg/dL (ref 8.9–10.3)
Chloride: 92 mmol/L — ABNORMAL LOW (ref 98–111)
Creatinine, Ser: 0.6 mg/dL (ref 0.44–1.00)
GFR, Estimated: >60 mL/min (ref >60)
Glucose, Bld: 61 mg/dL — ABNORMAL LOW (ref 70–99)
Potassium: 3.4 mmol/L — ABNORMAL LOW (ref 3.5–5.1)
Sodium: 130 mmol/L — ABNORMAL LOW (ref 135–145)
Total Bilirubin: 0.6 mg/dL (ref 0.3–1.2)
Total Protein: 7 g/dL (ref 6.5–8.1)

## 2023-02-12 LAB — PROTIME-INR
INR: 0.9 (ref 0.8–1.2)
Prothrombin Time: 11.8 s (ref 11.4–15.2)

## 2023-02-12 LAB — DIFFERENTIAL
Abs Immature Granulocytes: 0.02 10*3/uL (ref 0.00–0.07)
Basophils Absolute: 0 10*3/uL (ref 0.0–0.1)
Basophils Relative: 1 %
Eosinophils Absolute: 0.1 10*3/uL (ref 0.0–0.5)
Eosinophils Relative: 2 %
Immature Granulocytes: 0 %
Lymphocytes Relative: 27 %
Lymphs Abs: 1.6 10*3/uL (ref 0.7–4.0)
Monocytes Absolute: 0.5 10*3/uL (ref 0.1–1.0)
Monocytes Relative: 8 %
Neutro Abs: 3.7 10*3/uL (ref 1.7–7.7)
Neutrophils Relative %: 62 %

## 2023-02-12 LAB — CBC
HCT: 41 % (ref 36.0–46.0)
Hemoglobin: 13.8 g/dL (ref 12.0–15.0)
MCH: 25.3 pg — ABNORMAL LOW (ref 26.0–34.0)
MCHC: 33.7 g/dL (ref 30.0–36.0)
MCV: 75.2 fL — ABNORMAL LOW (ref 80.0–100.0)
Platelets: 260 10*3/uL (ref 150–400)
RBC: 5.45 MIL/uL — ABNORMAL HIGH (ref 3.87–5.11)
RDW: 13.2 % (ref 11.5–15.5)
WBC: 5.9 10*3/uL (ref 4.0–10.5)
nRBC: 0 % (ref 0.0–0.2)

## 2023-02-12 LAB — HEMOGLOBIN A1C
Hgb A1c MFr Bld: 6.9 % — ABNORMAL HIGH (ref 4.8–5.6)
Mean Plasma Glucose: 151.33 mg/dL

## 2023-02-12 LAB — ETHANOL: Alcohol, Ethyl (B): <10 mg/dL (ref <10)

## 2023-02-12 LAB — APTT: aPTT: 26 s (ref 24–36)

## 2023-02-12 MED ORDER — STROKE: EARLY STAGES OF RECOVERY BOOK
Freq: Once | Status: AC
  Filled 2023-02-12: qty 1

## 2023-02-12 MED ORDER — DEXTROSE 50 % IV SOLN: 50.0000 mL | Freq: Once | INTRAVENOUS | Status: AC

## 2023-02-12 MED ORDER — SODIUM CHLORIDE 0.9% FLUSH
3.0000 mL | Freq: Once | INTRAVENOUS | Status: AC
  Administered 2023-02-12: 3 mL via INTRAVENOUS

## 2023-02-12 MED ORDER — ONDANSETRON HCL 4 MG/2ML IJ SOLN
4.0000 mg | INTRAMUSCULAR | Status: DC | PRN
  Administered 2023-02-12: 4 mg via INTRAVENOUS
  Filled 2023-02-12: qty 2

## 2023-02-12 MED ORDER — ALBUTEROL SULFATE (2.5 MG/3ML) 0.083% IN NEBU: 2.5000 mg | INHALATION_SOLUTION | Freq: Four times a day (QID) | RESPIRATORY_TRACT | Status: DC | PRN

## 2023-02-12 MED ORDER — SENNOSIDES-DOCUSATE SODIUM 8.6-50 MG PO TABS: 1.0000 | ORAL_TABLET | Freq: Every evening | ORAL | Status: DC | PRN

## 2023-02-12 MED ORDER — ACETAMINOPHEN 160 MG/5ML PO SOLN: 650.0000 mg | ORAL | Status: DC | PRN

## 2023-02-12 MED ORDER — ACETAMINOPHEN 325 MG PO TABS
650.0000 mg | ORAL_TABLET | ORAL | Status: DC | PRN
  Administered 2023-02-12: 650 mg via ORAL
  Filled 2023-02-12: qty 2

## 2023-02-12 MED ORDER — ENOXAPARIN SODIUM 40 MG/0.4ML IJ SOSY
40.0000 mg | PREFILLED_SYRINGE | INTRAMUSCULAR | Status: DC
  Administered 2023-02-12: 40 mg via SUBCUTANEOUS
  Filled 2023-02-12: qty 0.4

## 2023-02-12 MED ORDER — DEXTROSE 50 % IV SOLN
INTRAVENOUS | Status: AC
  Administered 2023-02-12: 50 mL via INTRAVENOUS
  Filled 2023-02-12: qty 50

## 2023-02-12 MED ORDER — ASPIRIN 325 MG PO TABS
325.0000 mg | ORAL_TABLET | Freq: Once | ORAL | Status: AC
  Administered 2023-02-12: 325 mg via ORAL
  Filled 2023-02-12: qty 1

## 2023-02-12 MED ORDER — ACETAMINOPHEN 650 MG RE SUPP: 650.0000 mg | RECTAL | Status: DC | PRN

## 2023-02-12 MED ORDER — ASPIRIN 325 MG PO TABS
325.0000 mg | ORAL_TABLET | Freq: Every day | ORAL | Status: DC
Start: 2023-02-12 — End: 2023-02-12

## 2023-02-12 MED ORDER — ASPIRIN 81 MG PO TBEC
81.0000 mg | DELAYED_RELEASE_TABLET | Freq: Every day | ORAL | Status: DC
  Administered 2023-02-13: 81 mg via ORAL
  Filled 2023-02-12: qty 1

## 2023-02-12 MED ORDER — ATORVASTATIN CALCIUM 20 MG PO TABS
40.0000 mg | ORAL_TABLET | Freq: Every day | ORAL | Status: DC
  Administered 2023-02-12 – 2023-02-13 (×2): 40 mg via ORAL
  Filled 2023-02-12 (×2): qty 2

## 2023-02-12 MED ORDER — INSULIN ASPART 100 UNIT/ML IJ SOLN
0.0000 [IU] | Freq: Three times a day (TID) | INTRAMUSCULAR | Status: DC
  Administered 2023-02-13: 2 [IU] via SUBCUTANEOUS
  Administered 2023-02-13: 8 [IU] via SUBCUTANEOUS
  Filled 2023-02-12 (×2): qty 1

## 2023-02-12 MED ORDER — GADOBUTROL 1 MMOL/ML IV SOLN
7.0000 mL | Freq: Once | INTRAVENOUS | Status: AC | PRN
Start: 2023-02-12 — End: 2023-02-12
  Administered 2023-02-12: 7 mL via INTRAVENOUS

## 2023-02-12 NOTE — Assessment & Plan Note (Signed)
-   A1c pending - Hold home metformin and glipizide - SSI, moderate

## 2023-02-12 NOTE — Consult Note (Signed)
[facility] Consult Services   Patient Name:   Pamela Heath, Pamela Heath Date of Birth:   09/13/1955 Identification Number:   MRN - [id number] Date of Service:   02/12/2023 16:58:49  Diagnosis:       G45.9 - Transient cerebral ischemic attack, unspecified  Impression:      67yo F with HTN, HLD, DM2, migraines, presenting with blurry vision and right facial droop. Symptoms now resolved with an NIHSS of 0 so no intervention warranted. Admit for further workup of presumed TIA.  Our recommendations are outlined below.  Recommendations:        Stroke/Telemetry Floor       Neuro Checks       Bedside Swallow Eval       DVT Prophylaxis       IV Fluids, Normal Saline       Head of Bed 30 Degrees       Euglycemia and Avoid Hyperthermia (PRN Acetaminophen)       Initiate dual antiplatelet therapy with Aspirin 81 mg daily and Clopidogrel 75 mg daily.       Antihypertensives PRN if Blood pressure is greater than 220/120 or there is a concern for End organ damage/contraindications for permissive HTN. If blood pressure is greater than 220/120 give labetalol PO or IV or Vasotec IV with a goal of 15% reduction in BP during the first 24 hours.       MRI brain without contrast       MRA head and neck       TTE       Lipids, HbA1c  Sign Out:       Discussed with Emergency Department Provider    ------------------------------------------------------------------------------  Advanced Imaging: Advanced Imaging Deferred because:  Non-disabling symptoms as verified by the patient; no cortical signs so not consistent with LVO   Metrics: Last Known Well: 02/12/2023 14:00:00 [facility] Notification Time: 02/12/2023 16:58:49 Arrival Time: 02/12/2023 16:25:00 Stamp Time: 02/12/2023 16:58:49 Initial Response Time: 02/12/2023 17:03:21 Symptoms: right facial droop, blurry vision. Initial patient interaction: 02/12/2023 17:07:30 NIHSS Assessment Completed: 02/12/2023  17:13:43 Patient is not a candidate for Thrombolytic. Thrombolytic Medical Decision: 02/12/2023 17:13:44 Patient was not deemed candidate for Thrombolytic because of following reasons: Resolved symptoms .  CT head showed no acute hemorrhage or acute core infarct. I personally Reviewed the CT Head and it Showed no acute changes.  Primary Provider Notified of Diagnostic Impression and Management Plan on: 02/12/2023 17:21:35    ------------------------------------------------------------------------------  History of Present Illness: Patient is a 67 year old Female.  Patient was brought by private transportation with symptoms of right facial droop, blurry vision. 67yo F with HTN, HLD, DM2, migraines, presenting with blurry vision and right facial droop and slurred speech. LKW 1400. She reports both eyes became blurry along with slurred speech and right facial weakness. No preceding falls/trauma. She now feels back to baseline. No associated headache, denies similar symptoms with her migraines. No prior CVA or TIA.    Past Medical History:      Hypertension      Diabetes Mellitus      Hyperlipidemia      Migraine Headaches      There is no history of Stroke  Medications:  No Anticoagulant use  Antiplatelet use: Yes aspirin Reviewed EMR for current medications  Allergies:  Reviewed  Social History: Drug Use: No  Family History:  There is no family history of premature cerebrovascular disease pertinent to this consultation  ROS : 14 Points Review  of Systems was performed and was negative except mentioned in HPI.  Past Surgical History: There Is No Surgical History Contributory To Today's Visit     Examination: BP(171/78), Pulse(106), Blood Glucose(65) 1A: Level of Consciousness - Alert; keenly responsive + 0 1B: Ask Month and Age - Both Questions Right + 0 1C: Blink Eyes & Squeeze Hands - Performs Both Tasks + 0 2: Test Horizontal Extraocular Movements - Normal +  0 3: Test Visual Fields - No Visual Loss + 0 4: Test Facial Palsy (Use Grimace if Obtunded) - Normal symmetry + 0 5A: Test Left Arm Motor Drift - No Drift for 10 Seconds + 0 5B: Test Right Arm Motor Drift - No Drift for 10 Seconds + 0 6A: Test Left Leg Motor Drift - No Drift for 5 Seconds + 0 6B: Test Right Leg Motor Drift - No Drift for 5 Seconds + 0 7: Test Limb Ataxia (FNF/Heel-Shin) - No Ataxia + 0 8: Test Sensation - Normal; No sensory loss + 0 9: Test Language/Aphasia - Normal; No aphasia + 0 10: Test Dysarthria - Normal + 0 11: Test Extinction/Inattention - No abnormality + 0  NIHSS Score: 0   Pre-Morbid Modified Rankin Scale: 0 Points = No symptoms at all  Spoke with : Dr. Anner Crete  This consult was conducted in real time using interactive audio and Immunologist. Patient was informed of the technology being used for this visit and agreed to proceed. Patient located in hospital and provider located at home/office setting.   Patient is being evaluated for possible acute neurologic impairment and high probability of imminent or life-threatening deterioration. I spent total of 35 minutes providing care to this patient, including time for face to face visit via telemedicine, review of medical records, imaging studies and discussion of findings with providers, the patient and/or family.   Dr Evorn Gong   [facility] For Inpatient follow-up with [facility] physician please call [facility] [phone]. This is not an outpatient service. Post hospital discharge, please contact hospital directly.  Please do not communicate with [facility] physicians via secure chat. If you have any questions, Please contact [facility]. Please call or reconsult our service if there are any clinical or diagnostic changes.

## 2023-02-12 NOTE — ED Provider Notes (Signed)
[facility] Provider Note    Event Date/Time   First MD Initiated Contact with Patient 02/12/23 1634     (approximate)   History   Code Stroke   HPI Pamela Heath is a 67 y.o. female with history of HTN, HLD, DM 2 presenting today for vision loss.  Patient reports around 2 PM she had onset of vision loss in her right eye.  Noted some confusion and possible droop to the right side of her mouth as well.  Symptoms were improving on the time of arrival to the ED but still not completely gone.  Denied any headaches.  No weakness or numbness to any of her extremities.  No prior history of stroke.  Reviewed most recent notes in chart.     Physical Exam   Triage Vital Signs: ED Triage Vitals [02/12/23 1631]  Encounter Vitals Group     BP (!) 171/78     Systolic BP Percentile      Diastolic BP Percentile      Pulse Rate (!) 106     Resp 20     Temp 98 F (36.7 C)     Temp Source Oral     SpO2 99 %     Weight      Height      Head Circumference      Peak Flow      Pain Score      Pain Loc      Pain Education      Exclude from Growth Chart     Most recent vital signs: Vitals:   02/12/23 1631  BP: (!) 171/78  Pulse: (!) 106  Resp: 20  Temp: 98 F (36.7 C)  SpO2: 99%   Physical Exam: I have reviewed the vital signs and nursing notes. General: Awake, alert, no acute distress.  Nontoxic appearing. Head:  Atraumatic, normocephalic.   ENT:  EOM intact, PERRL. Oral mucosa is pink and moist with no lesions. Neck: Neck is supple with full range of motion, No meningeal signs. Cardiovascular:  RRR, No murmurs. Peripheral pulses palpable and equal bilaterally. Respiratory:  Symmetrical chest wall expansion.  No rhonchi, rales, or wheezes.  Good air movement throughout.  No use of accessory muscles.   Musculoskeletal:  No cyanosis or edema. Moving extremities with full ROM Abdomen:  Soft, nontender, nondistended. Neuro:  GCS 15, moving all four  extremities, interacting appropriately. Speech clear.  Vision intact only to shadow on right eye and mostly blurry.  Extraocular motions intact.  Otherwise cranial nerves II through XII intact.  Sensation intact to bilateral upper and lower extremities.  5 out of 5 strength intact of bilateral upper and lower extremities.  No dysmetria.  No ataxia. Psych:  Calm, appropriate.   Skin:  Warm, dry, no rash.    ED Results / Procedures / Treatments   Labs (all labs ordered are listed, but only abnormal results are displayed) Labs Reviewed  CBC - Abnormal; Notable for the following components:      Result Value   RBC 5.45 (*)    MCV 75.2 (*)    MCH 25.3 (*)    All other components within normal limits  COMPREHENSIVE METABOLIC PANEL - Abnormal; Notable for the following components:   Sodium 130 (*)    Potassium 3.4 (*)    Chloride 92 (*)    Glucose, Bld 61 (*)    All other components within normal limits  CBG MONITORING, ED -  Abnormal; Notable for the following components:   Glucose-Capillary 65 (*)    All other components within normal limits  CBG MONITORING, ED - Abnormal; Notable for the following components:   Glucose-Capillary 66 (*)    All other components within normal limits  CBG MONITORING, ED - Abnormal; Notable for the following components:   Glucose-Capillary 208 (*)    All other components within normal limits  PROTIME-INR  APTT  DIFFERENTIAL  ETHANOL  I-STAT CREATININE, ED     EKG My EKG interpretation: Rate of 96, normal sinus rhythm, normal axis, normal intervals, no acute ST elevations or depressions   RADIOLOGY Independently interpreted CT head with no acute pathology   PROCEDURES:  Critical Care performed: Yes, see critical care procedure note(s)  .Critical Care  Performed by: Janith Lima, MD Authorized by: Janith Lima, MD   Critical care provider statement:    Critical care time (minutes):  30   Critical care was necessary to treat or prevent  imminent or life-threatening deterioration of the following conditions: Stroke symptoms with right-sided vision loss.   Critical care was time spent personally by me on the following activities:  Development of treatment plan with patient or surrogate, discussions with consultants, evaluation of patient's response to treatment, examination of patient, ordering and review of laboratory studies, ordering and review of radiographic studies, ordering and performing treatments and interventions, pulse oximetry, re-evaluation of patient's condition and review of old charts   Care discussed with: admitting provider      MEDICATIONS ORDERED IN ED: Medications  sodium chloride flush (NS) 0.9 % injection 3 mL (3 mLs Intravenous Given 02/12/23 1705)  dextrose 50 % solution 50 mL (50 mLs Intravenous Given 02/12/23 1705)     IMPRESSION / MDM / ASSESSMENT AND PLAN / ED COURSE  I reviewed the triage vital signs and the nursing notes.                              Differential diagnosis includes, but is not limited to, acute CVA, hypoglycemia, TIA  Patient's presentation is most consistent with acute presentation with potential threat to life or bodily function.  Patient is a 67 year old female presenting today for acute onset right-sided vision loss associated with right-sided facial droop.  Onset was at 2 PM which was approximately 2 hours before arrival.  Symptoms slowly improving but not completely resolved.  Patient was taken to ED scanner after being called a stroke alert.  Assessed by neurology with symptoms resolved at that time and an NIHSS of 0.  CT head without contrast showed no acute pathology.  No intervention indicated at this time with symptoms resolved.  Patient was found to also be slightly hypoglycemic and was given dextrose here which potentially could be contributing to her symptoms at this time.  However, neurologist does recommend admission to stroke/telemetry floor and follow-up imaging  with MRI brain and MRA head and neck along with TTE.  Also recommends initiation of aspirin and clopidogrel.  Patient admitted to hospitalist service for further care.  The patient is on the cardiac monitor to evaluate for evidence of arrhythmia and/or significant heart rate changes. Clinical Course as of 02/12/23 1806  Tue Feb 12, 2023  1645 Spoke with radiologist - CT head negative [DW]  1705 Glucose-Capillary(!): 66 Patient getting dextrose at this time [DW]  1717 Neruo - back to baseline. Hx of migraines but nothing like this before. No headache.  She is on aspirin at baseline. Presumed TIA. Recommending adding Plavix and admission for TIA workup [DW]  1750 Glucose(!): 61 Patient already given dextrose and now at 208 [DW]    Clinical Course User Index [DW] Janith Lima, MD     FINAL CLINICAL IMPRESSION(S) / ED DIAGNOSES   Final diagnoses:  Vision loss of right eye  TIA (transient ischemic attack)     Rx / DC Orders   ED Discharge Orders     None        Note:  This document was prepared using Dragon voice recognition software and may include unintentional dictation errors.   Janith Lima, MD 02/12/23 [phone]

## 2023-02-12 NOTE — ED Notes (Signed)
Patient transported to MRI 

## 2023-02-12 NOTE — Assessment & Plan Note (Signed)
-   Hold home antihypertensives until MRI results are back

## 2023-02-12 NOTE — Progress Notes (Signed)
Code Stroke activated @ 1637 in CT.  LKWT 1400. mRS 0.  Telespecialist paged at 1658 (requested by [facility] Neuro) and on camera @ 1703.  Josiah Lobo BSN, Occupational hygienist

## 2023-02-12 NOTE — H&P (Signed)
History and Physical    Patient: Pamela Heath NWG:[id number] DOB: [birth year] DOA: 02/12/2023 DOS: the patient was seen and examined on 02/12/2023 PCP: Shane Crutch, PA  Patient coming from: Home  Chief Complaint:  Chief Complaint  Patient presents with   Code Stroke   HPI: Pamela Heath is a 67 y.o. female with medical history significant of T2DM, HTN, HLD, GERD who presents to the ED due to right-sided blurry vision and facial droop.  Pamela Heath states that she was in her usual state of health earlier today when she had sudden onset right eye blurriness.  This occurred at approximately 2 PM.  Then she went to prep her mother's food and realized that she could not remember how to make her mother's coffee which was quite unusual for her.  She she did not notice that she was having slurred speech but she states that folks around her told her the right side of her mouth was drooping.  She denies any focal weakness, headache, nausea, vomiting, diarrhea, chest pain, shortness of breath or palpitations.  She denies any similar events in the past.  Her symptoms began to improve on the way to the ER and she notes that now all her symptoms seem completely resolved.  ED course: On arrival to the ED, patient was hypertensive at 171/78 with heart rate of 106.  She was saturating at 99% on room air.  She was afebrile at 98.  Initial CBG demonstrated a blood glucose of 65 with improvement to 208 after D50.  Her workup otherwise demonstrated an MCV of 75.2, sodium of 130, potassium 3.4, chloride 92 and otherwise no abnormalities. CT head was obtained that did not show any acute intracranial abnormality.  TRH contacted for TIA workup.  Review of Systems: As mentioned in the history of present illness. All other systems reviewed and are negative.  Past Medical History:  Diagnosis Date   Anemia    Anxiety    Arthritis    Diabetes mellitus without complication (HCC)    Gastritis    GERD  (gastroesophageal reflux disease)    Hyperlipemia    Hyperplastic colon polyp 06/10/2015   Hypertension    Insomnia    Migraines    Past Surgical History:  Procedure Laterality Date   BILATERAL CARPAL TUNNEL RELEASE     CATARACT EXTRACTION W/PHACO Right 02/07/2016   Procedure: CATARACT EXTRACTION PHACO AND INTRAOCULAR LENS PLACEMENT (IOC);  Surgeon: Galen Manila, MD;  Location: [facility] ORS;  Service: Ophthalmology;  Laterality: Right;   Lot# [id number] H [place]: 01:07.9 AP%: 17.4 CDE: 11.86   COLONOSCOPY WITH PROPOFOL N/A 06/10/2015   Procedure: COLONOSCOPY WITH PROPOFOL;  Surgeon: Elnita Maxwell, MD;  Location: [facility] ENDOSCOPY;  Service: Endoscopy;  Laterality: N/A;   ESOPHAGOGASTRODUODENOSCOPY (EGD) WITH PROPOFOL N/A 08/19/2015   Procedure: ESOPHAGOGASTRODUODENOSCOPY (EGD) WITH PROPOFOL;  Surgeon: Elnita Maxwell, MD;  Location: [facility] ENDOSCOPY;  Service: Endoscopy;  Laterality: N/A;   HAMMER TOE SURGERY     Social History:  reports that she has never smoked. She has never used smokeless tobacco. She reports that she does not drink alcohol and does not use drugs.  Allergies  Allergen Reactions   Neurontin [Gabapentin] Nausea And Vomiting   Watermelon [Citrullus Vulgaris]     Family History  Problem Relation Age of Onset   Breast cancer Mother 56   Breast cancer Sister 71    Prior to Admission medications   Medication Sig Start Date End Date Taking? Authorizing Provider  albuterol (  PROVENTIL HFA;VENTOLIN HFA) 108 (90 Base) MCG/ACT inhaler Inhale 2 puffs into the lungs every 6 (six) hours as needed for wheezing or shortness of breath. 06/04/16   Nita Sickle, MD  aspirin EC 81 MG tablet Take 81 mg by mouth daily.    [provider]  atorvastatin (LIPITOR) 40 MG tablet Take 40 mg by mouth daily.    [provider]  ferrous sulfate 325 (65 FE) MG tablet Take 1 tablet (325 mg total) by mouth 2 (two) times daily. 05/24/22 06/23/22  Triplett, Kasandra Knudsen, FNP   fexofenadine (ALLEGRA) 180 MG tablet Take 180 mg by mouth daily.    [provider]  glipiZIDE (GLUCOTROL) 10 MG tablet Take 10 mg by mouth daily before breakfast.    [provider]  hydrochlorothiazide (MICROZIDE) 12.5 MG capsule Take 12.5 mg by mouth daily.    [provider]  metFORMIN (GLUCOPHAGE) 1000 MG tablet Take 1,000 mg by mouth 2 (two) times daily with a meal.    [provider]  metoprolol tartrate (LOPRESSOR) 25 MG tablet Take 25 mg by mouth daily.    [provider]  omeprazole (PRILOSEC) 20 MG capsule Take 20 mg by mouth daily.    [provider]  potassium chloride (K-DUR,KLOR-CON) 10 MEQ tablet Take 1 tablet (10 mEq total) by mouth 2 (two) times daily. 06/04/16   Nita Sickle, MD  quinapril (ACCUPRIL) 10 MG tablet Take 40 mg by mouth daily.     [provider]  SUMAtriptan (IMITREX) 50 MG tablet Take 1 tablet (50 mg total) by mouth once as needed for migraine. May repeat in 2 hours if headache persists or recurs. Patient not taking: Reported on 02/07/2016 10/10/14 10/10/15  Emily Filbert, MD  Travoprost, BAK Free, (TRAVATAN) 0.004 % SOLN ophthalmic solution 1 drop at bedtime.    [provider]    Physical Exam: Vitals:   02/12/23 1631 02/12/23 1923  BP: (!) 171/78   Pulse: (!) 106   Resp: 20   Temp: 98 F (36.7 C)   TempSrc: Oral   SpO2: 99%   Weight:  69.4 kg  Height:  5\' 3"  (1.6 m)   Physical Exam Vitals and nursing note reviewed.  Constitutional:      General: She is not in acute distress.    Appearance: She is normal weight. She is not toxic-appearing.  HENT:     Head: Normocephalic and atraumatic.     Mouth/Throat:     Mouth: Mucous membranes are moist.     Pharynx: Oropharynx is clear.  Eyes:     Extraocular Movements: Extraocular movements intact.     Conjunctiva/sclera: Conjunctivae normal.     Pupils: Pupils are equal, round, and reactive to light.  Cardiovascular:      Rate and Rhythm: Normal rate and regular rhythm.     Heart sounds: No murmur heard.    No gallop.  Pulmonary:     Effort: Pulmonary effort is normal. No respiratory distress.     Breath sounds: Normal breath sounds. No wheezing, rhonchi or rales.  Abdominal:     General: Bowel sounds are normal.     Palpations: Abdomen is soft.  Musculoskeletal:     Right lower leg: No edema.     Left lower leg: No edema.  Skin:    General: Skin is warm and dry.  Neurological:     Mental Status: She is alert.     Comments:  Patient is alert and oriented x  4.  No dysarthria noted with her face relaxed, the right side of her mouth seems to be drooped compared to the left, however this corrects when smiling. Sensation grossly intact throughout 5 out of 5 strength throughout Gait intact  Psychiatric:        Mood and Affect: Mood normal.        Behavior: Behavior normal.    Data Reviewed: CBC with WBC of 5.9, hemoglobin of 13.8, MCV of 75.2, platelets of 260 CMP with sodium of 130, potassium 3.4, chloride 92, glucose 61, bicarb 25, BUN 14, creatinine 0.60, AST 22, ALT 18 and GFR above 60 INR 0.9 PTT 26 Alcohol level negative  CT HEAD CODE STROKE WO CONTRAST  Result Date: 02/12/2023 CLINICAL DATA:  Code stroke.  Neuro deficit, acute, stroke suspected EXAM: CT HEAD WITHOUT CONTRAST TECHNIQUE: Contiguous axial images were obtained from the base of the skull through the vertex without intravenous contrast. RADIATION DOSE REDUCTION: This exam was performed according to the departmental dose-optimization program which includes automated exposure control, adjustment of the mA and/or kV according to patient size and/or use of iterative reconstruction technique. COMPARISON:  None Available. FINDINGS: Brain: No evidence of acute large vascular territory infarction, hemorrhage, hydrocephalus, extra-axial collection or mass lesion/mass effect. Vascular: No hyperdense vessel identified. Skull: No acute  fracture. Sinuses/Orbits: Clear sinuses.  No acute orbital findings. Other: No mastoid effusions. ASPECTS [facility] Stroke Program Early CT Score) total score (0-10 with 10 being normal): 10. IMPRESSION: 1. No evidence of acute intracranial abnormality. 2. ASPECTS is 10. Code stroke a discussed Jones imaging results were communicated on 02/12/2023 at 4:42 pm to provider Dr. Vicente Males via telephone, who verbally acknowledged these results. Electronically Signed   By: Feliberto Harts M.D.   On: 02/12/2023 16:43    Results are pending, will review when available.  Assessment and Plan:  * TIA (transient ischemic attack) Patient presented with right eye blurry vision, facial droop and slurred speech although she did not notice her droop and slurred speech.  All symptoms have resolved at this time overall consistent with TIA.  - Neurology consulted; appreciate their recommendations - MRI brain pending - MRA head/neck pending - Telemetry monitoring - Allow for permissive HTN (systolic < 220 and diastolic < 120) pending MRI results - Echocardiogram  - A1C and Lipid panel  - S/p ASA 325 mg. Continue 81 mg daily - Continue home high intensity statin - PT/OT  Type 2 diabetes mellitus without complications (HCC) - A1c pending - Hold home metformin and glipizide - SSI, moderate  Hyperlipidemia - Lipid panel pending - Continue home high intensity statin  Essential (primary) hypertension - Hold home antihypertensives until MRI results are back  Advance Care Planning:   Code Status: Full Code   Consults: Neurology  Family Communication: No family at bedside  Severity of Illness: The appropriate patient status for this patient is OBSERVATION. Observation status is judged to be reasonable and necessary in order to provide the required intensity of service to ensure the patient's safety. The patient's presenting symptoms, physical exam findings, and initial radiographic and laboratory data in the  context of their medical condition is felt to place them at decreased risk for further clinical deterioration. Furthermore, it is anticipated that the patient will be medically stable for discharge from the hospital within 2 midnights of admission.   Author: Verdene Lennert, MD 02/12/2023 8:01 PM  For on call review [email or web address].

## 2023-02-12 NOTE — Assessment & Plan Note (Signed)
-   Lipid panel pending - Continue home high intensity statin

## 2023-02-12 NOTE — ED Notes (Signed)
Patient is complaining of nausea. Informed Dr.mansy placed orders for zofran

## 2023-02-12 NOTE — Assessment & Plan Note (Addendum)
Patient presented with right eye blurry vision, facial droop and slurred speech although she did not notice her droop and slurred speech.  All symptoms have resolved at this time overall consistent with TIA.  - Neurology consulted; appreciate their recommendations - MRI brain pending - MRA head/neck pending - Telemetry monitoring - Allow for permissive HTN (systolic < 220 and diastolic < 120) pending MRI results - Echocardiogram  - A1C and Lipid panel  - S/p ASA 325 mg. Continue 81 mg daily - Continue home high intensity statin - PT/OT

## 2023-02-12 NOTE — ED Notes (Signed)
Activated Code Stroke w/ [facility]) per Linton Rump, RN

## 2023-02-12 NOTE — ED Triage Notes (Signed)
Pt to triage via wheelchair.  Pt with blurred vision.  Pt had episode of confusion, mouth drawn to one side.   No headache.  Pt alert.Pamela Heath

## 2023-02-13 ENCOUNTER — Observation Stay
Admit: 2023-02-13 | Discharge: 2023-02-13 | Disposition: A | Discharge status: Home / Self Care | Payer: Medicare Other | Attending: Internal Medicine

## 2023-02-13 DIAGNOSIS — G459 Transient cerebral ischemic attack, unspecified: Secondary | ICD-10-CM | POA: Diagnosis not present

## 2023-02-13 LAB — BASIC METABOLIC PANEL
Anion gap: 11 (ref 5–15)
BUN: 12 mg/dL (ref 8–23)
CO2: 27 mmol/L (ref 22–32)
Calcium: 9 mg/dL (ref 8.9–10.3)
Chloride: 89 mmol/L — ABNORMAL LOW (ref 98–111)
Creatinine, Ser: 0.57 mg/dL (ref 0.44–1.00)
GFR, Estimated: >60 mL/min (ref >60)
Glucose, Bld: 230 mg/dL — ABNORMAL HIGH (ref 70–99)
Potassium: 3.6 mmol/L (ref 3.5–5.1)
Sodium: 127 mmol/L — ABNORMAL LOW (ref 135–145)

## 2023-02-13 LAB — ECHOCARDIOGRAM COMPLETE
AR max vel: 2.32 cm2
AV Area VTI: 2.19 cm2
AV Area mean vel: 2.26 cm2
AV Mean grad: 3 mm[Hg]
AV Peak grad: 7 mm[Hg]
Ao pk vel: 1.32 m/s
Area-P 1/2: 5.54 cm2
Height: 63 in
MV VTI: 3.11 cm2
S' Lateral: 3.2 cm
Weight: 2448 [oz_av]

## 2023-02-13 LAB — CBG MONITORING, ED
Glucose-Capillary: 139 mg/dL — ABNORMAL HIGH (ref 70–99)
Glucose-Capillary: 251 mg/dL — ABNORMAL HIGH (ref 70–99)

## 2023-02-13 LAB — LIPID PANEL
Cholesterol: 177 mg/dL (ref 0–200)
HDL: 46 mg/dL (ref >40)
LDL Cholesterol: 54 mg/dL (ref 0–99)
Total CHOL/HDL Ratio: 3.8 {ratio}
Triglycerides: 386 mg/dL — ABNORMAL HIGH (ref <150)
VLDL: 77 mg/dL — ABNORMAL HIGH (ref 0–40)

## 2023-02-13 LAB — PHOSPHORUS: Phosphorus: 4 mg/dL (ref 2.5–4.6)

## 2023-02-13 LAB — MAGNESIUM: Magnesium: 1.5 mg/dL — ABNORMAL LOW (ref 1.7–2.4)

## 2023-02-13 LAB — OSMOLALITY: Osmolality: 281 mosm/kg (ref 275–295)

## 2023-02-13 LAB — TSH: TSH: 1.591 u[IU]/mL (ref 0.350–4.500)

## 2023-02-13 LAB — HIV ANTIBODY (ROUTINE TESTING W REFLEX): HIV Screen 4th Generation wRfx: NONREACTIVE

## 2023-02-13 MED ORDER — SODIUM CHLORIDE 1 G PO TABS
1.0000 g | ORAL_TABLET | Freq: Two times a day (BID) | ORAL | 0 refills | Status: AC
Start: 2023-02-13 — End: 2023-02-20

## 2023-02-13 MED ORDER — MAGNESIUM OXIDE 400 MG PO CAPS: 400.0000 mg | ORAL_CAPSULE | Freq: Two times a day (BID) | ORAL | 0 refills | Status: AC

## 2023-02-13 MED ORDER — FENOFIBRATE 48 MG PO TABS: 48.0000 mg | ORAL_TABLET | Freq: Every day | ORAL | 2 refills | Status: AC

## 2023-02-13 MED ORDER — PANTOPRAZOLE SODIUM 40 MG PO TBEC
40.0000 mg | DELAYED_RELEASE_TABLET | Freq: Every day | ORAL | Status: DC
  Administered 2023-02-13: 40 mg via ORAL
  Filled 2023-02-13: qty 1

## 2023-02-13 NOTE — Evaluation (Signed)
Physical Therapy Evaluation Patient Details Name: Pamela Heath MRN: [id number] DOB: March 06, 1956 Today's Date: 02/13/2023  History of Present Illness  67 y.o. female with history of HTN, HLD, DM 2 presenting today for vision loss.  Patient reports around 2 PM she had onset of vision loss in her right eye.  Noted some confusion and possible droop to the right side of her mouth as well.  Symptoms were improving on the time of arrival to the ED  Clinical Impression  Pt pleasant and willing to work with PT, she reports feeling as though she is back to baseline and performed all mobility/ambulation/etc as if this was the case.  She easily transitioned up to sitting EOB and to standing (no AD) w/o issue or hesitation.  Pt easily able to ambulate ~200 ft w/o AD with community appropriate speed and confidence.  Good overall safety, equal and functional strength in U&LEs, no residual symptoms/concerns.  Pt is clear to d/c home when medically ready, no further rehab needs; pt in agreement.  PT will sign off.        If plan is discharge home, recommend the following:     Can travel by private vehicle        Equipment Recommendations None recommended by PT  Recommendations for Other Services       Functional Status Assessment Patient has not had a recent decline in their functional status     Precautions / Restrictions Precautions Precautions: Fall Restrictions Weight Bearing Restrictions: No      Mobility  Bed Mobility Overal bed mobility: Independent                  Transfers Overall transfer level: Independent Equipment used: None               General transfer comment: easily rises to standing w/o issue    Ambulation/Gait Ambulation/Gait assistance: Supervision Gait Distance (Feet): 200 Feet Assistive device: None         General Gait Details: Pt did well with ambulation and overall showed great effort, safety, confidence and reports being at her  baseline  Stairs            Wheelchair Mobility     Tilt Bed    Modified Rankin (Stroke Patients Only)       Balance Overall balance assessment: Modified Independent                                           Pertinent Vitals/Pain Pain Assessment Pain Assessment: No/denies pain    Home Living Family/patient expects to be discharged to:: Private residence Living Arrangements: Alone Available Help at Discharge: Friend(s);Available PRN/intermittently   Home Access: Stairs to enter Entrance Stairs-Rails: Right;Left Entrance Stairs-Number of Steps: 5     Home Equipment:  (states she hasn't used them but could have access if needed)      Prior Function Prior Level of Function : Independent/Modified Independent             Mobility Comments: Pt drives, stays active, cares for her mother 3 days a week, stays active, tries to walk for exercises a few times/wk. ADLs Comments: independent with all tasks     Extremity/Trunk Assessment   Upper Extremity Assessment Upper Extremity Assessment: Overall WFL for tasks assessed (= b/l)    Lower Extremity Assessment Lower Extremity Assessment: Overall WFL for tasks  assessed (= b/l)       Communication   Communication Communication: No apparent difficulties  Cognition Arousal: Alert Behavior During Therapy: WFL for tasks assessed/performed Overall Cognitive Status: Within Functional Limits for tasks assessed                                          General Comments General comments (skin integrity, edema, etc.): Pt reports feeling that she is back to her baseline, agrees that she does not need continued PT    Exercises     Assessment/Plan    PT Assessment Patient does not need any further PT services  PT Problem List Decreased coordination;Decreased strength       PT Treatment Interventions      PT Goals (Current goals can be found in the Care Plan section)  Acute  Rehab PT Goals Patient Stated Goal: go home today PT Goal Formulation: All assessment and education complete, DC therapy    Frequency  (eval only)     Co-evaluation               AM-PAC PT "6 Clicks" Mobility  Outcome Measure Help needed turning from your back to your side while in a flat bed without using bedrails?: None Help needed moving from lying on your back to sitting on the side of a flat bed without using bedrails?: None Help needed moving to and from a bed to a chair (including a wheelchair)?: None Help needed standing up from a chair using your arms (e.g., wheelchair or bedside chair)?: None Help needed to walk in hospital room?: None Help needed climbing 3-5 steps with a railing? : None 6 Click Score: 24    End of Session   Activity Tolerance: Patient tolerated treatment well Patient left: in bed;with call bell/phone within reach Nurse Communication: Mobility status PT Visit Diagnosis: Other symptoms and signs involving the nervous system (R29.898)    Time: 5573-2202 PT Time Calculation (min) (ACUTE ONLY): 15 min   Charges:   PT Evaluation $PT Eval Low Complexity: 1 Low   PT General Charges $$ ACUTE PT VISIT: 1 Visit         Malachi Pro, DPT 02/13/2023, 10:32 AM

## 2023-02-13 NOTE — ED Notes (Signed)
Up to b/r, steady gait, alert, NAD, calm, interactive, no changes

## 2023-02-13 NOTE — ED Notes (Signed)
PT at [facility]. [place] into room, at [facility]. Pt sitting up eating.

## 2023-02-13 NOTE — ED Notes (Signed)
PT at [facility].

## 2023-02-13 NOTE — Progress Notes (Addendum)
SLP Cancellation Note  Patient Details Name: TYISHIA STCLAIR MRN: [id number] DOB: [birth year]   Cancelled treatment:       Reason Eval/Treat Not Completed: SLP screened, no needs identified, will sign off (chart reviewed; consulted NSG then met w/ pt in room.)  Pt denied any difficulty swallowing and is currently on a regular diet; tolerates swallowing pills w/ water per NSG. Asking for, and drinking, coffee in room during visit w/ no deficits noted.  Pt conversed in full conversation w/out overt expressive/receptive deficits noted; pt denied any speech-language deficits. Speech clear. Pt stated she was ready to go home to her cat. Pt A/O x4 in conversation.  No further skilled ST services indicated as pt appears at her baseline. Pt agreed. NSG to reconsult if any change in status while admitted.      Jerilynn Som, MS, CCC-SLP Speech Language Pathologist Rehab Services; [facility] [phone] (ascom) Jafar Poffenberger 02/13/2023, 9:24 AM

## 2023-02-13 NOTE — ED Notes (Signed)
Pt alert, NAD, calm, interactive, resps e/u, speaking in clear complete sentences. Denies pain, HA, sob, nausea, dizziness, weakness, visual changes. States, "feel normal, ready to go home". Meal given.

## 2023-02-13 NOTE — Discharge Summary (Signed)
[facility] Discharge Summary   Heath: Pamela Heath AVW:[id number]  PCP: Shane Crutch, PA  Date of admission: 02/12/2023   Date of discharge:  02/13/2023     Discharge Diagnoses:  Principal Problem:   TIA (transient ischemic attack) Active Problems:   Essential (primary) hypertension   Hyperlipidemia   Type 2 diabetes mellitus without complications (HCC)   Admitted From: Home Disposition:  Home   Recommendations for Outpatient Follow-up:  PCP: in  1 wk Follow-up with neurology in 1 to 2 weeks  Follow up LABS/TEST: Check BMP and magnesium level in 1 week   Follow-up Information     Shane Crutch, PA Follow up in 1 week(s).   Specialty: Family Medicine Why: Repeat BMP and magnesium level after 1 week.  Lipid profile in 3 months Contact information: [address] [place] [address] [phone]                Diet recommendation: Cardiac and Carb modified diet  Activity: Pamela Heath is advised to gradually reintroduce usual activities, as tolerated  Discharge Condition: stable  Code Status: Full code   History of present illness: As per Pamela H and P dictated on admission  Hospital Course:  Pamela Heath is a 66 y.o. female with medical history significant of T2DM, HTN, HLD, GERD who presents to [facility] ED due to right-sided blurry vision and facial droop.   Pamela Heath states that she was in her usual state of health earlier today when she had sudden onset right eye blurriness.  This occurred at approximately 2 PM.  Then she went to prep her mother's food and realized that she could not remember how to make her mother's coffee which was quite unusual for her.  She she did not notice that she was having slurred speech but she states that folks around her told her Pamela right side of her mouth was drooping.  She denies any focal weakness, headache, nausea, vomiting, diarrhea,w chest pain, shortness of breath or palpitations.  She denies any similar  events in Pamela past.   Her symptoms began to improve on Pamela way to Pamela ER and she notes that now all her symptoms seem completely resolved.   ED course: On arrival to Pamela ED, Heath was hypertensive at 171/78 with heart rate of 106.  She was saturating at 99% on room air.  She was afebrile at 98.  Initial CBG demonstrated a blood glucose of 65 with improvement to 208 after D50.  Her workup otherwise demonstrated an MCV of 75.2, sodium of 130, potassium 3.4, chloride 92 and otherwise no abnormalities. CT head was obtained that did not show any acute intracranial abnormality.  [facility] contacted for TIA workup.   Assessment and Plan:   # TIA (transient ischemic attack) Heath presented with right eye blurry vision, facial droop and slurred speech although she did not notice her droop and slurred speech.  All symptoms have resolved at this time overall consistent with TIA. MRI and MRA: 1. No acute intracranial abnormality. 2. Chronic blood products in Pamela posterior right parietal lobe. 3. Normal intracranial MRA. 4. Normal MRA of Pamela neck.  TTE LVEF 50 to 55%, no wall motion abnormality, grade 1 diastolic dysfunction, negative PFO. TSH 1.5 normal range, LDL 54 below goal <70, HbA1c 6.9 well-controlled Neurology was consulted, recommended to continue aspirin 81 mg p.o. daily and follow-up as an outpatient. # Isotonic hyponatremia, most likely due to poor nutritional intake and due to hydrochlorothiazide.  Sodium 130--127, serum osmolality 281 within normal range.  Discontinued HCTZ.  Prescribed sodium chloride 1 g p.o. twice daily for 7 days.  Follow with PCP to repeat BMP after 1 week. # Hypomagnesemia, started magnesium oxide 400 mg p.o. twice daily for 7 days.  Follow with PCP to repeat mag level after 7 days # Type 2 diabetes mellitus without complications: HbA1c 6.9, well-controlled.  Resumed home medications.  Heath was advised to monitor CBG at home, continue diabetic diet and follow with PCP. #  Hyperlipidemia: LDL 54, triglyceride 386 elevated, started fenofibrate 48 mg p.o. daily.  Follow with PCP to repeat lipid profile after 3 months. # Essential (primary) hypertension: Resumed losartan on discharge.  Body mass index is 27.1 kg/m.  Nutrition Interventions:  Heath was advised to stay overnight due to hyponatremia but Heath wanted to be discharged home because she has a cat and she lives alone so she was requesting to be discharged.  Heath verbalized understanding risk and benefit of being discharged.  Heath was ambulatory without any assistance. On Pamela day of Pamela discharge Pamela Heath's vitals were stable, and no other acute medical condition were reported by Heath. Pamela Heath was felt safe to be discharge at Home.  Consultants: Neurology Procedures: None  Discharge Exam: General: Appear in no distress, no Rash; Oral Mucosa Clear, moist. Cardiovascular: S1 and S2 Present, no Murmur, Respiratory: normal respiratory effort, Bilateral Air entry present and no Crackles, no wheezes Abdomen: Bowel Sound present, Soft and no tenderness, no hernia Extremities: no Pedal edema, no calf tenderness Neurology: alert and oriented to time, place, and person affect appropriate.  Filed Weights   02/12/23 1923  Weight: 69.4 kg   Vitals:   02/13/23 1330 02/13/23 1333  BP:    Pulse: 95   Resp: 19   Temp:  98.6 F (37 C)  SpO2: 100%     DISCHARGE MEDICATION: Allergies as of 02/13/2023       Reactions   Neurontin [gabapentin] Nausea And Vomiting   Watermelon [citrullus Vulgaris]         Medication List     STOP taking these medications    atorvastatin 40 MG tablet Commonly known as: LIPITOR   hydrochlorothiazide 12.5 MG capsule Commonly known as: MICROZIDE   metoprolol tartrate 25 MG tablet Commonly known as: LOPRESSOR   potassium chloride 10 MEQ tablet Commonly known as: KLOR-CON M   SUMAtriptan 50 MG tablet Commonly known as: Imitrex   Travoprost  (BAK Free) 0.004 % Soln ophthalmic solution Commonly known as: TRAVATAN       TAKE these medications    albuterol 108 (90 Base) MCG/ACT inhaler Commonly known as: VENTOLIN HFA Inhale 2 puffs into Pamela lungs every 6 (six) hours as needed for wheezing or shortness of breath.   aspirin EC 81 MG tablet Take 81 mg by mouth daily.   fenofibrate 48 MG tablet Commonly known as: Tricor Take 1 tablet (48 mg total) by mouth daily.   ferrous sulfate 325 (65 FE) MG tablet Take 1 tablet (325 mg total) by mouth 2 (two) times daily.   fexofenadine 180 MG tablet Commonly known as: ALLEGRA Take 180 mg by mouth daily.   glipiZIDE 10 MG tablet Commonly known as: GLUCOTROL Take 10 mg by mouth daily before breakfast.   losartan 100 MG tablet Commonly known as: COZAAR Take 100 mg by mouth daily.   Magnesium Oxide 400 MG Caps Take 1 capsule (400 mg total) by mouth 2 (two) times daily for  7 days.   metFORMIN 1000 MG tablet Commonly known as: GLUCOPHAGE Take 1,000 mg by mouth 2 (two) times daily with a meal.   omeprazole 20 MG capsule Commonly known as: PRILOSEC Take 20 mg by mouth daily.   sodium chloride 1 g tablet Take 1 tablet (1 g total) by mouth 2 (two) times daily with a meal for 7 days.   Tradjenta 5 MG Tabs tablet Generic drug: linagliptin Take 5 mg by mouth daily.       Allergies  Allergen Reactions   Neurontin [Gabapentin] Nausea And Vomiting   Watermelon [Citrullus Vulgaris]    Discharge Instructions     Call MD for:   Complete by: As directed    Headache or dizziness, vision changes, any weakness or numbness.   Call MD for:  difficulty breathing, headache or visual disturbances   Complete by: As directed    Call MD for:  extreme fatigue   Complete by: As directed    Call MD for:  persistant dizziness or light-headedness   Complete by: As directed    Call MD for:  persistant nausea and vomiting   Complete by: As directed    Call MD for:  severe uncontrolled  pain   Complete by: As directed    Call MD for:  temperature >100.4   Complete by: As directed    Diet - low sodium heart healthy   Complete by: As directed    Discharge instructions   Complete by: As directed    Follow-up with PCP in 1 week, repeat BMP and magnesium level after 1 week and lipid profile after 3 months. Heath's sodium level is low, prescribed sodium chloride tablet for 7 days and magnesium oxide for 7 days.  Started fenofibrate low-dose due to elevated triglyceride.   Increase activity slowly   Complete by: As directed        Pamela results of significant diagnostics from this hospitalization (including imaging, microbiology, ancillary and laboratory) are listed below for reference.    Significant Diagnostic Studies: ECHOCARDIOGRAM COMPLETE  Result Date: 02/13/2023    ECHOCARDIOGRAM REPORT   Heath Name:   GLENDALE METZLER Bastidas Date of Exam: 02/13/2023 Medical Rec #:  [id number]       Height:       63.0 in Accession #:    [id number]      Weight:       153.0 lb Date of Birth:  July 20, 1955      BSA:          1.726 m Heath Age:    66 years        BP:           142/81 mmHg Heath Gender: F               HR:           94 bpm. Exam Location:  [facility] Procedure: 2D Echo, Cardiac Doppler and Color Doppler Indications:     Stroke  History:         Heath has no prior history of Echocardiogram examinations.                  Risk Factors:Hypertension, Diabetes and Dyslipidemia.  Sonographer:     Mikki Harbor Referring Phys:  [id number] Verdene Lennert Diagnosing Phys: Marcina Millard MD IMPRESSIONS  1. Left ventricular ejection fraction, by estimation, is 50 to 55%. Pamela left ventricle has low normal function. Pamela left ventricle has no regional wall motion abnormalities. Left  ventricular diastolic parameters are consistent with Grade I diastolic dysfunction (impaired relaxation).  2. Right ventricular systolic function is normal. Pamela right ventricular size is normal. There is normal pulmonary  artery systolic pressure.  3. Pamela mitral valve is normal in structure. Trivial mitral valve regurgitation. No evidence of mitral stenosis.  4. Pamela aortic valve is normal in structure. Aortic valve regurgitation is not visualized. No aortic stenosis is present.  5. Pamela inferior vena cava is normal in size with greater than 50% respiratory variability, suggesting right atrial pressure of 3 mmHg. FINDINGS  Left Ventricle: Left ventricular ejection fraction, by estimation, is 50 to 55%. Pamela left ventricle has low normal function. Pamela left ventricle has no regional wall motion abnormalities. Pamela left ventricular internal cavity size was normal in size. There is no left ventricular hypertrophy. Left ventricular diastolic parameters are consistent with Grade I diastolic dysfunction (impaired relaxation). Right Ventricle: Pamela right ventricular size is normal. No increase in right ventricular wall thickness. Right ventricular systolic function is normal. There is normal pulmonary artery systolic pressure. Pamela tricuspid regurgitant velocity is 2.10 m/s, and  with an assumed right atrial pressure of 3 mmHg, Pamela estimated right ventricular systolic pressure is 20.6 mmHg. Left Atrium: Left atrial size was normal in size. Right Atrium: Right atrial size was normal in size. Pericardium: There is no evidence of pericardial effusion. Mitral Valve: Pamela mitral valve is normal in structure. Trivial mitral valve regurgitation. No evidence of mitral valve stenosis. MV peak gradient, 4.2 mmHg. Pamela mean mitral valve gradient is 2.0 mmHg. Tricuspid Valve: Pamela tricuspid valve is normal in structure. Tricuspid valve regurgitation is trivial. No evidence of tricuspid stenosis. Aortic Valve: Pamela aortic valve is normal in structure. Aortic valve regurgitation is not visualized. No aortic stenosis is present. Aortic valve mean gradient measures 3.0 mmHg. Aortic valve peak gradient measures 7.0 mmHg. Aortic valve area, by VTI measures 2.19 cm.  Pulmonic Valve: Pamela pulmonic valve was normal in structure. Pulmonic valve regurgitation is not visualized. No evidence of pulmonic stenosis. Aorta: Pamela aortic root is normal in size and structure. Venous: Pamela inferior vena cava is normal in size with greater than 50% respiratory variability, suggesting right atrial pressure of 3 mmHg. IAS/Shunts: No atrial level shunt detected by color flow Doppler.  LEFT VENTRICLE PLAX 2D LVIDd:         4.30 cm   Diastology LVIDs:         3.20 cm   LV e' medial:    8.27 cm/s LV PW:         1.10 cm   LV E/e' medial:  9.3 LV IVS:        1.10 cm   LV e' lateral:   9.14 cm/s LVOT diam:     2.00 cm   LV E/e' lateral: 8.4 LV SV:         63 LV SV Index:   36 LVOT Area:     3.14 cm  RIGHT VENTRICLE RV Basal diam:  3.10 cm RV Mid diam:    2.80 cm RV S prime:     14.90 cm/s TAPSE (M-mode): 2.1 cm LEFT ATRIUM             Index        RIGHT ATRIUM           Index LA diam:        3.30 cm 1.91 cm/m   RA Area:     11.80 cm LA Vol (A2C):  38.7 ml 22.43 ml/m  RA Volume:   24.20 ml  14.02 ml/m LA Vol (A4C):   34.9 ml 20.23 ml/m LA Biplane Vol: 37.5 ml 21.73 ml/m  AORTIC VALVE                    PULMONIC VALVE AV Area (Vmax):    2.32 cm     PV Vmax:       1.24 m/s AV Area (Vmean):   2.26 cm     PV Peak grad:  6.2 mmHg AV Area (VTI):     2.19 cm AV Vmax:           132.00 cm/s AV Vmean:          84.600 cm/s AV VTI:            0.286 m AV Peak Grad:      7.0 mmHg AV Mean Grad:      3.0 mmHg LVOT Vmax:         97.40 cm/s LVOT Vmean:        60.900 cm/s LVOT VTI:          0.199 m LVOT/AV VTI ratio: 0.70  AORTA Ao Root diam: 3.10 cm MITRAL VALVE                TRICUSPID VALVE MV Area (PHT): 5.54 cm     TR Peak grad:   17.6 mmHg MV Area VTI:   3.11 cm     TR Vmax:        210.00 cm/s MV Peak grad:  4.2 mmHg MV Mean grad:  2.0 mmHg     SHUNTS MV Vmax:       1.03 m/s     Systemic VTI:  0.20 m MV Vmean:      72.8 cm/s    Systemic Diam: 2.00 cm MV Decel Time: 137 msec MV E velocity: 77.00 cm/s MV A  velocity: 105.00 cm/s MV E/A ratio:  0.73 Marcina Millard MD Electronically signed by Marcina Millard MD Signature Date/Time: 02/13/2023/1:33:58 PM    Final    MR BRAIN WO CONTRAST  Result Date: 02/12/2023 CLINICAL DATA:  Right eye vision loss.  Right facial droop. EXAM: MRI HEAD WITHOUT CONTRAST MRA HEAD WITHOUT CONTRAST MRA NECK WITHOUT AND WITH CONTRAST TECHNIQUE: Multiplanar, multi-echo pulse sequences of Pamela brain and surrounding structures were acquired without intravenous contrast. Angiographic images of Pamela Circle of Willis were acquired using MRA technique without intravenous contrast. Angiographic images of Pamela neck were acquired using MRA technique without and with intravenous contrast. Carotid stenosis measurements (when applicable) are obtained utilizing NASCET criteria, using Pamela distal internal carotid diameter as Pamela denominator. CONTRAST:  7mL GADAVIST GADOBUTROL 1 MMOL/ML IV SOLN COMPARISON:  None Available. FINDINGS: MRI HEAD FINDINGS Brain: No acute infarct, mass effect or extra-axial collection. Chronic blood products in Pamela posterior right parietal lobe. There is multifocal hyperintense T2-weighted signal within Pamela white matter. Parenchymal volume and CSF spaces are normal. Pamela midline structures are normal. Vascular: Normal flow voids. Skull and upper cervical spine: Normal calvarium and skull base. Visualized upper cervical spine and soft tissues are normal. Sinuses/Orbits:No paranasal sinus fluid levels or advanced mucosal thickening. No mastoid or middle ear effusion. Normal orbits. MRA HEAD FINDINGS POSTERIOR CIRCULATION: --Vertebral arteries: Normal --Inferior cerebellar arteries: Normal. --Basilar artery: Normal. --Superior cerebellar arteries: Normal. --Posterior cerebral arteries: Normal. ANTERIOR CIRCULATION: --Intracranial internal carotid arteries: Normal. --Anterior cerebral arteries (ACA): Normal. --Middle cerebral arteries (MCA): Normal. Anatomic variants: None  MRA  NECK FINDINGS Aortic arch: Normal 3 vessel branching pattern. Right carotid system: Normal Left carotid system: Normal Vertebral arteries: Codominant and normal. Other: None IMPRESSION: 1. No acute intracranial abnormality. 2. Chronic blood products in Pamela posterior right parietal lobe. 3. Normal intracranial MRA. 4. Normal MRA of Pamela neck. Electronically Signed   By: Deatra Robinson M.D.   On: 02/12/2023 23:01   MR ANGIO HEAD WO CONTRAST  Result Date: 02/12/2023 CLINICAL DATA:  Right eye vision loss.  Right facial droop. EXAM: MRI HEAD WITHOUT CONTRAST MRA HEAD WITHOUT CONTRAST MRA NECK WITHOUT AND WITH CONTRAST TECHNIQUE: Multiplanar, multi-echo pulse sequences of Pamela brain and surrounding structures were acquired without intravenous contrast. Angiographic images of Pamela Circle of Willis were acquired using MRA technique without intravenous contrast. Angiographic images of Pamela neck were acquired using MRA technique without and with intravenous contrast. Carotid stenosis measurements (when applicable) are obtained utilizing NASCET criteria, using Pamela distal internal carotid diameter as Pamela denominator. CONTRAST:  7mL GADAVIST GADOBUTROL 1 MMOL/ML IV SOLN COMPARISON:  None Available. FINDINGS: MRI HEAD FINDINGS Brain: No acute infarct, mass effect or extra-axial collection. Chronic blood products in Pamela posterior right parietal lobe. There is multifocal hyperintense T2-weighted signal within Pamela white matter. Parenchymal volume and CSF spaces are normal. Pamela midline structures are normal. Vascular: Normal flow voids. Skull and upper cervical spine: Normal calvarium and skull base. Visualized upper cervical spine and soft tissues are normal. Sinuses/Orbits:No paranasal sinus fluid levels or advanced mucosal thickening. No mastoid or middle ear effusion. Normal orbits. MRA HEAD FINDINGS POSTERIOR CIRCULATION: --Vertebral arteries: Normal --Inferior cerebellar arteries: Normal. --Basilar artery: Normal. --Superior  cerebellar arteries: Normal. --Posterior cerebral arteries: Normal. ANTERIOR CIRCULATION: --Intracranial internal carotid arteries: Normal. --Anterior cerebral arteries (ACA): Normal. --Middle cerebral arteries (MCA): Normal. Anatomic variants: None MRA NECK FINDINGS Aortic arch: Normal 3 vessel branching pattern. Right carotid system: Normal Left carotid system: Normal Vertebral arteries: Codominant and normal. Other: None IMPRESSION: 1. No acute intracranial abnormality. 2. Chronic blood products in Pamela posterior right parietal lobe. 3. Normal intracranial MRA. 4. Normal MRA of Pamela neck. Electronically Signed   By: Deatra Robinson M.D.   On: 02/12/2023 23:01   MR ANGIO NECK W WO CONTRAST  Result Date: 02/12/2023 CLINICAL DATA:  Right eye vision loss.  Right facial droop. EXAM: MRI HEAD WITHOUT CONTRAST MRA HEAD WITHOUT CONTRAST MRA NECK WITHOUT AND WITH CONTRAST TECHNIQUE: Multiplanar, multi-echo pulse sequences of Pamela brain and surrounding structures were acquired without intravenous contrast. Angiographic images of Pamela Circle of Willis were acquired using MRA technique without intravenous contrast. Angiographic images of Pamela neck were acquired using MRA technique without and with intravenous contrast. Carotid stenosis measurements (when applicable) are obtained utilizing NASCET criteria, using Pamela distal internal carotid diameter as Pamela denominator. CONTRAST:  7mL GADAVIST GADOBUTROL 1 MMOL/ML IV SOLN COMPARISON:  None Available. FINDINGS: MRI HEAD FINDINGS Brain: No acute infarct, mass effect or extra-axial collection. Chronic blood products in Pamela posterior right parietal lobe. There is multifocal hyperintense T2-weighted signal within Pamela white matter. Parenchymal volume and CSF spaces are normal. Pamela midline structures are normal. Vascular: Normal flow voids. Skull and upper cervical spine: Normal calvarium and skull base. Visualized upper cervical spine and soft tissues are normal. Sinuses/Orbits:No  paranasal sinus fluid levels or advanced mucosal thickening. No mastoid or middle ear effusion. Normal orbits. MRA HEAD FINDINGS POSTERIOR CIRCULATION: --Vertebral arteries: Normal --Inferior cerebellar arteries: Normal. --Basilar artery: Normal. --Superior cerebellar arteries: Normal. --Posterior cerebral arteries: Normal. ANTERIOR CIRCULATION: --  Intracranial internal carotid arteries: Normal. --Anterior cerebral arteries (ACA): Normal. --Middle cerebral arteries (MCA): Normal. Anatomic variants: None MRA NECK FINDINGS Aortic arch: Normal 3 vessel branching pattern. Right carotid system: Normal Left carotid system: Normal Vertebral arteries: Codominant and normal. Other: None IMPRESSION: 1. No acute intracranial abnormality. 2. Chronic blood products in Pamela posterior right parietal lobe. 3. Normal intracranial MRA. 4. Normal MRA of Pamela neck. Electronically Signed   By: Deatra Robinson M.D.   On: 02/12/2023 23:01   CT HEAD CODE STROKE WO CONTRAST  Result Date: 02/12/2023 CLINICAL DATA:  Code stroke.  Neuro deficit, acute, stroke suspected EXAM: CT HEAD WITHOUT CONTRAST TECHNIQUE: Contiguous axial images were obtained from Pamela base of Pamela skull through Pamela vertex without intravenous contrast. RADIATION DOSE REDUCTION: This exam was performed according to Pamela departmental dose-optimization program which includes automated exposure control, adjustment of the mA and/or kV according to Heath size and/or use of iterative reconstruction technique. COMPARISON:  None Available. FINDINGS: Brain: No evidence of acute large vascular territory infarction, hemorrhage, hydrocephalus, extra-axial collection or mass lesion/mass effect. Vascular: No hyperdense vessel identified. Skull: No acute fracture. Sinuses/Orbits: Clear sinuses.  No acute orbital findings. Other: No mastoid effusions. ASPECTS [facility] Stroke Program Early CT Score) total score (0-10 with 10 being normal): 10. IMPRESSION: 1. No evidence of acute  intracranial abnormality. 2. ASPECTS is 10. Code stroke a discussed Jones imaging results were communicated on 02/12/2023 at 4:42 pm to provider Dr. Vicente Males via telephone, who verbally acknowledged these results. Electronically Signed   By: Feliberto Harts M.D.   On: 02/12/2023 16:43    Microbiology: No results found for this or any previous visit (from Pamela past 240 hour(s)).   Labs: CBC: Recent Labs  Lab 02/12/23 1700  WBC 5.9  NEUTROABS 3.7  HGB 13.8  HCT 41.0  MCV 75.2*  PLT 260   Basic Metabolic Panel: Recent Labs  Lab 02/12/23 1700 02/13/23 0400  NA 130* 127*  K 3.4* 3.6  CL 92* 89*  CO2 25 27  GLUCOSE 61* 230*  BUN 14 12  CREATININE 0.60 0.57  CALCIUM 9.5 9.0  MG  --  1.5*  PHOS  --  4.0   Liver Function Tests: Recent Labs  Lab 02/12/23 1700  AST 22  ALT 18  ALKPHOS 59  BILITOT 0.6  PROT 7.0  ALBUMIN 4.4   No results for input(s): "LIPASE", "AMYLASE" in Pamela last 168 hours. No results for input(s): "AMMONIA" in Pamela last 168 hours. Cardiac Enzymes: No results for input(s): "CKTOTAL", "CKMB", "CKMBINDEX", "TROPONINI" in Pamela last 168 hours. BNP (last 3 results) No results for input(s): "BNP" in Pamela last 8760 hours. CBG: Recent Labs  Lab 02/12/23 1702 02/12/23 1717 02/12/23 2255 02/13/23 0740 02/13/23 1141  GLUCAP 66* 208* 173* 139* 251*    Time spent: 35 minutes  Signed:  Gillis Santa  [facility] 02/13/2023 2:28 PM

## 2023-02-13 NOTE — Progress Notes (Signed)
*  PRELIMINARY RESULTS* Echocardiogram 2D Echocardiogram has been performed.  Pamela Heath 02/13/2023, 9:22 AM

## 2023-02-13 NOTE — Progress Notes (Signed)
OT Cancellation Note  Patient Details Name: Pamela Heath MRN: [id number] DOB: 14-Aug-1955   Cancelled Treatment:    Reason Eval/Treat Not Completed: OT screened, no needs identified, will sign off. Per PT, pt with no OT needs.  Alvester Morin 02/13/2023, 1:01 PM

## 2023-02-21 ENCOUNTER — Ambulatory Visit
Admission: RE | Admit: 2023-02-21 | Discharge: 2023-02-21 | Disposition: A | Discharge status: Home / Self Care | Payer: Medicare Other | Source: Ambulatory Visit | Attending: Family Medicine | Admitting: Family Medicine

## 2023-02-21 DIAGNOSIS — Z1231 Encounter for screening mammogram for malignant neoplasm of breast: Secondary | ICD-10-CM

## 2023-03-09 ENCOUNTER — Emergency Department: Payer: Medicare Other

## 2023-03-09 ENCOUNTER — Emergency Department
Admission: EM | Admit: 2023-03-09 | Discharge: 2023-03-09 | Disposition: A | Discharge status: Home / Self Care | Payer: Medicare Other | Attending: Emergency Medicine | Admitting: Emergency Medicine

## 2023-03-09 DIAGNOSIS — M25512 Pain in left shoulder: Secondary | ICD-10-CM | POA: Diagnosis not present

## 2023-03-09 DIAGNOSIS — M7532 Calcific tendinitis of left shoulder: Secondary | ICD-10-CM | POA: Diagnosis not present

## 2023-03-09 DIAGNOSIS — Z79899 Other long term (current) drug therapy: Secondary | ICD-10-CM | POA: Insufficient documentation

## 2023-03-09 DIAGNOSIS — M24012 Loose body in left shoulder: Secondary | ICD-10-CM | POA: Diagnosis not present

## 2023-03-09 DIAGNOSIS — M79602 Pain in left arm: Secondary | ICD-10-CM | POA: Diagnosis present

## 2023-03-09 DIAGNOSIS — Y900 Blood alcohol level of less than 20 mg/100 ml: Secondary | ICD-10-CM | POA: Insufficient documentation

## 2023-03-09 DIAGNOSIS — R29810 Facial weakness: Secondary | ICD-10-CM | POA: Insufficient documentation

## 2023-03-09 DIAGNOSIS — E119 Type 2 diabetes mellitus without complications: Secondary | ICD-10-CM | POA: Diagnosis not present

## 2023-03-09 DIAGNOSIS — Q670 Congenital facial asymmetry: Secondary | ICD-10-CM | POA: Insufficient documentation

## 2023-03-09 DIAGNOSIS — I10 Essential (primary) hypertension: Secondary | ICD-10-CM | POA: Diagnosis not present

## 2023-03-09 DIAGNOSIS — I69823 Fluency disorder following other cerebrovascular disease: Secondary | ICD-10-CM | POA: Diagnosis present

## 2023-03-09 DIAGNOSIS — Z8673 Personal history of transient ischemic attack (TIA), and cerebral infarction without residual deficits: Secondary | ICD-10-CM | POA: Diagnosis not present

## 2023-03-09 DIAGNOSIS — R519 Headache, unspecified: Secondary | ICD-10-CM | POA: Insufficient documentation

## 2023-03-09 DIAGNOSIS — I69892 Facial weakness following other cerebrovascular disease: Secondary | ICD-10-CM | POA: Diagnosis present

## 2023-03-09 LAB — COMPREHENSIVE METABOLIC PANEL
ALT: 29 U/L (ref 0–44)
AST: 27 U/L (ref 15–41)
Albumin: 4.7 g/dL (ref 3.5–5.0)
Alkaline Phosphatase: 72 U/L (ref 38–126)
Anion gap: 12 (ref 5–15)
BUN: 11 mg/dL (ref 8–23)
CO2: 26 mmol/L (ref 22–32)
Calcium: 9.5 mg/dL (ref 8.9–10.3)
Chloride: 96 mmol/L — ABNORMAL LOW (ref 98–111)
Creatinine, Ser: 0.55 mg/dL (ref 0.44–1.00)
GFR, Estimated: >60 mL/min (ref >60)
Glucose, Bld: 190 mg/dL — ABNORMAL HIGH (ref 70–99)
Potassium: 3.5 mmol/L (ref 3.5–5.1)
Sodium: 134 mmol/L — ABNORMAL LOW (ref 135–145)
Total Bilirubin: 0.7 mg/dL (ref <1.2)
Total Protein: 8.2 g/dL — ABNORMAL HIGH (ref 6.5–8.1)

## 2023-03-09 LAB — CBC
HCT: 43.4 % (ref 36.0–46.0)
Hemoglobin: 14.2 g/dL (ref 12.0–15.0)
MCH: 25.5 pg — ABNORMAL LOW (ref 26.0–34.0)
MCHC: 32.7 g/dL (ref 30.0–36.0)
MCV: 78.1 fL — ABNORMAL LOW (ref 80.0–100.0)
Platelets: 283 10*3/uL (ref 150–400)
RBC: 5.56 MIL/uL — ABNORMAL HIGH (ref 3.87–5.11)
RDW: 13.1 % (ref 11.5–15.5)
WBC: 8.3 10*3/uL (ref 4.0–10.5)
nRBC: 0 % (ref 0.0–0.2)

## 2023-03-09 LAB — DIFFERENTIAL
Abs Immature Granulocytes: 0.03 10*3/uL (ref 0.00–0.07)
Basophils Absolute: 0 10*3/uL (ref 0.0–0.1)
Basophils Relative: 1 %
Eosinophils Absolute: 0.1 10*3/uL (ref 0.0–0.5)
Eosinophils Relative: 1 %
Immature Granulocytes: 0 %
Lymphocytes Relative: 16 %
Lymphs Abs: 1.3 10*3/uL (ref 0.7–4.0)
Monocytes Absolute: 0.5 10*3/uL (ref 0.1–1.0)
Monocytes Relative: 7 %
Neutro Abs: 6.3 10*3/uL (ref 1.7–7.7)
Neutrophils Relative %: 75 %

## 2023-03-09 LAB — PROTIME-INR
INR: 0.9 (ref 0.8–1.2)
Prothrombin Time: 12.5 s (ref 11.4–15.2)

## 2023-03-09 LAB — TROPONIN I (HIGH SENSITIVITY): Troponin I (High Sensitivity): 3 ng/L (ref <18)

## 2023-03-09 LAB — APTT: aPTT: 26 s (ref 24–36)

## 2023-03-09 LAB — ETHANOL: Alcohol, Ethyl (B): <10 mg/dL (ref <10)

## 2023-03-09 MED ORDER — KETOROLAC TROMETHAMINE 30 MG/ML IJ SOLN
15.0000 mg | Freq: Once | INTRAMUSCULAR | Status: AC
  Administered 2023-03-09: 15 mg via INTRAVENOUS
  Filled 2023-03-09: qty 1

## 2023-03-09 MED ORDER — SODIUM CHLORIDE 0.9% FLUSH: 3.0000 mL | Freq: Once | INTRAVENOUS | Status: DC

## 2023-03-09 MED ORDER — OXYCODONE-ACETAMINOPHEN 5-325 MG PO TABS: 1.0000 | ORAL_TABLET | ORAL | 0 refills | Status: AC | PRN

## 2023-03-09 MED ORDER — MORPHINE SULFATE (PF) 4 MG/ML IV SOLN
4.0000 mg | Freq: Once | INTRAVENOUS | Status: DC
  Filled 2023-03-09: qty 1

## 2023-03-09 NOTE — ED Notes (Signed)
This RN applied shoulder immobilizer. Pt stating that she did not like it and did not want to take it RN made MD aware. Pt given discharge instructions and verbalized understanding.

## 2023-03-09 NOTE — ED Notes (Signed)
Pt ambulatory to BR

## 2023-03-09 NOTE — ED Provider Notes (Signed)
[facility] Provider Note    Event Date/Time   First MD Initiated Contact with Patient 03/09/23 1213     (approximate)   History   Chief Complaint Shoulder Pain and Facial Droop   HPI  Pamela Heath is a 67 y.o. female with past medical history of hypertension, hyperlipidemia, diabetes, migraines, anemia, and TIA who presents to the ED complaining of shoulder pain.  Patient states that for the past week she has had increasing pain in her left shoulder, particularly with movement.  She denies any recent trauma to the shoulder, has been using Lidoderm patches without significant relief.  She denies any pain in her chest and has not had any difficulty breathing.  She initially presented to the walk-in clinic, was referred to the ED due to concern for right-sided facial droop.  Patient reports that she was admitted to the hospital at the end of October for facial droop and vision changes in her right eye, but has not had any issues since then.  Per chart, patient's symptoms had resolved and were managed as a TIA.  She denies any changes in her vision, had not noticed that the facial droop had returned.     Physical Exam   Triage Vital Signs: ED Triage Vitals  Encounter Vitals Group     BP 03/09/23 1046 (!) 174/79     Systolic BP Percentile --      Diastolic BP Percentile --      Pulse Rate 03/09/23 1046 80     Resp 03/09/23 1046 18     Temp 03/09/23 1046 98 F (36.7 C)     Temp Source 03/09/23 1046 Oral     SpO2 03/09/23 1046 99 %     Weight --      Height --      Head Circumference --      Peak Flow --      Pain Score 03/09/23 1048 10     Pain Loc --      Pain Education --      Exclude from Growth Chart --     Most recent vital signs: Vitals:   03/09/23 1046 03/09/23 1300  BP: (!) 174/79 (!) 181/83  Pulse: 80 89  Resp: 18 20  Temp: 98 F (36.7 C)   SpO2: 99% 100%    Constitutional: Alert and oriented. Eyes: Conjunctivae are  normal. Head: Atraumatic. Nose: No congestion/rhinnorhea. Mouth/Throat: Mucous membranes are moist.  Cardiovascular: Normal rate, regular rhythm. Grossly normal heart sounds.  2+ radial pulses bilaterally. Respiratory: Normal respiratory effort.  No retractions. Lungs CTAB. Gastrointestinal: Soft and nontender. No distention. Musculoskeletal: No lower extremity tenderness nor edema.  Diffuse tenderness to palpation at left shoulder with no erythema, edema, or warmth.  Patient able to range left shoulder with discomfort. Neurologic:  Normal speech and language. Possible right facial droop noted, strength 5 out of 5 in bilateral upper and lower extremities.    ED Results / Procedures / Treatments   Labs (all labs ordered are listed, but only abnormal results are displayed) Labs Reviewed  CBC - Abnormal; Notable for the following components:      Result Value   RBC 5.56 (*)    MCV 78.1 (*)    MCH 25.5 (*)    All other components within normal limits  COMPREHENSIVE METABOLIC PANEL - Abnormal; Notable for the following components:   Sodium 134 (*)    Chloride 96 (*)    Glucose, Bld  190 (*)    Total Protein 8.2 (*)    All other components within normal limits  PROTIME-INR  APTT  DIFFERENTIAL  ETHANOL  TROPONIN I (HIGH SENSITIVITY)     EKG  ED ECG REPORT I, Chesley Noon, the attending physician, personally viewed and interpreted this ECG.   Date: 03/09/2023  EKG Time: 10:52  Rate: 93  Rhythm: normal sinus rhythm  Axis: Normal  Intervals:none  ST&T Change: None  RADIOLOGY CT head reviewed and interpreted by me with no hemorrhage or midline shift.  PROCEDURES:  Critical Care performed: No  Procedures   MEDICATIONS ORDERED IN ED: Medications  sodium chloride flush (NS) 0.9 % injection 3 mL ( Intravenous Canceled Entry 03/09/23 1057)  ketorolac (TORADOL) 30 MG/ML injection 15 mg (has no administration in time range)     IMPRESSION / MDM / ASSESSMENT AND PLAN  / ED COURSE  I reviewed the triage vital signs and the nursing notes.                              67 y.o. female with past medical history of hypertension, hyperlipidemia, diabetes, migraines, anemia, and TIA who presents to the ED with 1 week of atraumatic left shoulder pain, noted to have right-sided facial droop at walk-in clinic.  Patient's presentation is most consistent with acute presentation with potential threat to life or bodily function.  Differential diagnosis includes, but is not limited to, stroke, TIA, facial asymmetry, fracture, dislocation, septic arthritis, osteoarthritis.  Patient nontoxic-appearing and in no acute distress, vital signs are unremarkable.  She was referred from the walk-in clinic due to concern for right facial droop, did have recent admission for TIA with symptoms including right facial droop that subsequently resolved.  Visual symptoms from that TIA have remained resolved and patient is unsure exactly how long any drooping may have been present.  I reviewed the picture from patient's driver's license as well as picture seen on epic, both of which seem to display some baseline facial asymmetry that appears similar to today.  Patient has equal smile and I doubt acute stroke, MRI during prior admission was negative and do not feel repeat indicated at this time.  Patient states that her face has not looked any different to her and she is primarily concerned about left shoulder pain.  Left shoulder x-ray shows calcific tendinopathy of the rotator cuff along with loose bodies in the joint.  No findings concerning for septic arthritis.  Labs are reassuring with no significant anemia, leukocytosis, electrolyte abnormality, or AKI.  LFTs are also unremarkable, troponin within normal limits.  Pain improved following dose of IV Toradol and patient is appropriate for outpatient management with orthopedic follow-up.  She was placed in sling for comfort, counseled to remove this at  least once a day to prevent frozen shoulder.  She will be provided short course of pain medication, was counseled to return to the ED for new or worsening symptoms.  Patient agrees with plan.      FINAL CLINICAL IMPRESSION(S) / ED DIAGNOSES   Final diagnoses:  Acute pain of left shoulder  Facial asymmetry     Rx / DC Orders   ED Discharge Orders          Ordered    oxyCODONE-acetaminophen (PERCOCET) 5-325 MG tablet  Every 4 hours PRN        03/09/23 1312  Note:  This document was prepared using Dragon voice recognition software and may include unintentional dictation errors.   Chesley Noon, MD 03/09/23 [phone]

## 2023-03-09 NOTE — ED Triage Notes (Addendum)
Pt to ED via POV from [facility]. Pt went to [facility] for left should pain that has been ongoing x1 wk. Pt was sent over due to slurred speech and facial droop. Pt with hx TIA. Pt reports numbness in both feet this am that has resolved. Pt A&Ox4. Speech clear. Pt unable to lift left arm due to pain. Sensation in tact. Slight right sided facial droop noted. Pt was unaware of droop and drove self here. Unknown LKW.

## 2024-01-01 NOTE — Progress Notes (Signed)
 Established Patient Visit   Chief Complaint:No chief complaint on file.  Date of Service: 01/13/2024 Date of Birth: 08/12/1955 [facility]  History of Present Illness: Ms. Bridgeforth is a 68 y.o.female patient who presents for a 1 year follow up. PMH significant for hyperlipidemia, hypertension, anxiety disorder, type 2 DM, and GERD.  Today's in-visit BP was elevated, 160/68. Her heart rate was 97 bpm. Patient states that she is doing well. Denies having any recent chest pain, recent falls, SOB. She has some headaches and is being seen by neurology for them. Start Cardizem. Order month supply of testing strips for diabetes treatment.  Visit Summaries: 01/14/2023 Patient was seen by me for a follow up. Stopped lipitor.  Past Medical and Surgical History  Past Medical History Past Medical History:  Diagnosis Date  . Anxiety disorder   . Diabetes mellitus type 2, uncomplicated (CMS/HHS-HCC)   . Fundic gland polyps of stomach, benign 08/19/2015  . GERD (gastroesophageal reflux disease)   . Hyperlipidemia   . Hyperplastic colon polyp 06/10/2015  . Hypertension   . IDA (iron deficiency anemia)   . Insomnia   . Migraines   . Pill-induced gastritis 08/19/2015    Past Surgical History She has a past surgical history that includes Carpal tunnel release (Bilateral); Correction Hammertoe; Colonoscopy (06/10/2015); and egd (08/19/2015).   Medications and Allergies  Current Medications  Current Outpatient Medications  Medication Sig Dispense Refill  . aspirin  81 MG EC tablet Take 81 mg by mouth.    . fenofibrate  nanocrystallized (TRICOR ) 48 MG tablet Take 48 mg by mouth    . ferrous sulfate  325 (65 FE) MG tablet Take 325 mg by mouth 2 (two) times daily    . glipiZIDE (GLUCOTROL) 10 MG tablet Take 10 mg by mouth.    . hydroCHLOROthiazide (HYDRODIURIL) 25 MG tablet Take 25 mg by mouth once daily    . ibuprofen  (MOTRIN ) 600 MG tablet Take 1 tablet (600 mg total) by mouth every 6 (six)  hours as needed for Pain 30 tablet 1  . linagliptin (TRADJENTA) 5 mg Tab Take 5 mg by mouth once daily.    SABRA loratadine (CLARITIN) 10 mg tablet Take 10 mg by mouth as needed for Allergies    . losartan (COZAAR) 100 MG tablet Take 1 tablet (100 mg total) by mouth once daily 30 tablet 11  . metFORMIN (GLUCOPHAGE) 500 MG tablet Take 500 mg by mouth 2 (two) times daily with meals    . omeprazole (PRILOSEC) 20 MG DR capsule Take 20 mg by mouth once daily    . pregabalin (LYRICA) 25 MG capsule Take 25 mg twice daily for one week then increase to 50 mg twice daily for head pain 60 capsule 2  . rizatriptan (MAXALT) 10 MG tablet Take  10 mg at headache onset. Take another tablet in 2 hours if needed. Take no more than 2 tablets in a 24 hour period. 9 tablet 1  . triamcinolone 0.1 % cream Apply topically    . aspirin /acetaminophen /caffeine (EXCEDRIN EXTRA STRENGTH ORAL) Take by mouth    . dilTIAZem (CARDIZEM CD) 120 MG XR capsule Take 1 capsule (120 mg total) by mouth once daily 30 capsule 6  . hydrochlorothiazide (MICROZIDE) 12.5 mg capsule Take 12.5 mg by mouth. (Patient not taking: Reported on 01/13/2024)    . nortriptyline (PAMELOR) 10 MG capsule START NORTRIPTYLINE (PAMELOR) 10 MG NIGHTLY FOR ONE WEEK, THEN INCREASE TO 20 MG NIGHTLY (Patient not taking: Reported on 01/13/2024) 180 capsule 0  .  rizatriptan (MAXALT) 10 MG tablet Take 1 tab at headache onset. May take a second dose after 2 hours if needed.  No more than 2 doses in 24 hours (Patient not taking: Reported on 01/13/2024) 9 tablet 1   No current facility-administered medications for this visit.    Allergies: Watermelon and Gabapentin  Social and Family History  Social History  reports that she has never smoked. She has never used smokeless tobacco. She reports that she does not drink alcohol and does not use drugs.  Family History Family History  Problem Relation Name Age of Onset  . Stroke Sister    . Breast cancer Mother    . Diabetes  Mother    . High blood pressure (Hypertension) Mother    . Appendicitis Mother    . Ulcers Father    . Stroke Father    . Myocardial Infarction (Heart attack) Father    . Gallbladder disease Brother    . No Known Problems Son      Review of Systems   Pertinent positives and negatives are mentioned above in HPI and all other systems are negative.  Physical Examination   Vitals:BP (!) 160/68 (BP Location: Left upper arm, Patient Position: Sitting, BP Cuff Size: Adult)   Pulse 97   Ht 157.5 cm (5' 2)   Wt 70.9 kg (156 lb 3.2 oz)   SpO2 99%   BMI 28.57 kg/m  Ht:157.5 cm (5' 2) Wt:70.9 kg (156 lb 3.2 oz) ADJ:Anib surface area is 1.76 meters squared. Body mass index is 28.57 kg/m.  HEENT: Pupils equally reactive to light and accomodation  Neck: Supple without thyromegaly, carotid pulses 2+ Lungs: clear to auscultation bilaterally; no wheezes, rales, rhonchi Heart: Regular rate and rhythm.  No gallops, murmurs or rub Abdomen: soft nontender, nondistended, with normal bowel sounds Extremities: no cyanosis, clubbing, or edema Peripheral Pulses: 2+ in all extremities, 2+ femoral pulses bilaterally Neurologic: Alert and oriented X3; speech intact; face symmetrical; moves all extremities well  Cardiovascular Studies:    Echocardiogram 2D complete: 02/13/2023 IMPRESSIONS   1. Left ventricular ejection fraction, by estimation, is 50 to 55%. The left ventricle has low normal function. The left ventricle has no regional wall motion abnormalities. Left ventricular diastolic parameters are consistent with Grade I diastolic  dysfunction (impaired relaxation).   2. Right ventricular systolic function is normal. The right ventricular size is normal. There is normal pulmonary artery systolic pressure.   3. The mitral valve is normal in structure. Trivial mitral valve regurgitation. No evidence of mitral stenosis.   4. The aortic valve is normal in structure. Aortic valve regurgitation is not  visualized. No aortic stenosis is present.   5. The inferior vena cava is normal in size with greater than 50% respiratory variability, suggesting right atrial pressure of 3 mmHg.    07/25/22 INTERPRETATION NORMAL LEFT VENTRICULAR SYSTOLIC FUNCTION WITH AN ESTIMATED EF = 50-55 % NORMAL RIGHT VENTRICULAR SYSTOLIC FUNCTION MILD TRICUSPID AND MITRAL VALVE INSUFFICIENCY NO VALVULAR STENOSIS GLS: -15.7 %  NM Myocardial Perfusion SPECT multiple (stress and rest): (11/28/2016) IMPRESSION: Normal myocardial perfusion scan no evidence of stress-induced myocardial-ischemia ejection fraction of 61% conclusion negative scan  Cardiac Catheterization:   Holter:  Cardiac CT Scan:  Cardiac MRI:   Assessment   68 y.o. female with  1. Essential (primary) hypertension   2. TIA (transient ischemic attack)   3. Hyperlipidemia, unspecified hyperlipidemia type   4. Type 2 diabetes mellitus without complication, unspecified whether long term insulin  use (  CMS/HHS-HCC)   5. Gastro-esophageal reflux disease without esophagitis   6. Anxiety disorder, unspecified type     Plan   History of TIA no residual deficits continue blood pressure control losartan/HCTZ as well as aspirin  continue outpatient follow-up with neurology consider adding Cardizem to help with stricter blood pressure control and heart rateHypertension, BP is 160/68, continue losartan, HCTZ, start cardizem Hyperlipidemia chronic, discontinued Lipitor due to a rash consider alternative therapy for lipid management possibly Zetia possibly PCSK9 Diabetes, on glipizide and metformin, management per PCP A1c goal less than 7 GERD, continue omeprazole therapy for reflux symptoms Anxiety, chronic, recommend reassurance, management per PCP    Return in about 6 months (around 07/12/2024).  This note is partially written by Leita Ellen, in the presence of and acting as the scribe of Dr. Cara Lovelace.      Leita Ellen  I have reviewed,  edited and added to the note to reflect my best personal medical judgment.  Attestation Statement:   I personally performed the service. (TP)  DWAYNE JONETTA LOVELACE, MD  [facility] [place], [place] Ph:  [phone] Fax:  [phone] This note was generated in part with voice recognition software, Dragon.  I apologize for any typographical errors that were not detected and corrected from this process.  They are unintentional.

## 2024-01-16 ENCOUNTER — Other Ambulatory Visit: Payer: Self-pay

## 2024-01-16 ENCOUNTER — Emergency Department

## 2024-01-16 ENCOUNTER — Encounter: Payer: Self-pay | Admitting: Emergency Medicine

## 2024-01-16 ENCOUNTER — Emergency Department
Admission: EM | Admit: 2024-01-16 | Discharge: 2024-01-17 | Disposition: A | Discharge status: Home / Self Care | Attending: Emergency Medicine | Admitting: Emergency Medicine

## 2024-01-16 DIAGNOSIS — R1011 Right upper quadrant pain: Secondary | ICD-10-CM | POA: Diagnosis present

## 2024-01-16 DIAGNOSIS — K529 Noninfective gastroenteritis and colitis, unspecified: Secondary | ICD-10-CM | POA: Diagnosis not present

## 2024-01-16 DIAGNOSIS — E119 Type 2 diabetes mellitus without complications: Secondary | ICD-10-CM | POA: Diagnosis not present

## 2024-01-16 DIAGNOSIS — E876 Hypokalemia: Secondary | ICD-10-CM | POA: Insufficient documentation

## 2024-01-16 DIAGNOSIS — E871 Hypo-osmolality and hyponatremia: Secondary | ICD-10-CM | POA: Insufficient documentation

## 2024-01-16 DIAGNOSIS — I10 Essential (primary) hypertension: Secondary | ICD-10-CM | POA: Insufficient documentation

## 2024-01-16 LAB — URINALYSIS, ROUTINE W REFLEX MICROSCOPIC
Bacteria, UA: NONE SEEN
Bilirubin Urine: NEGATIVE
Glucose, UA: 50 mg/dL — AB
Hgb urine dipstick: NEGATIVE
Ketones, ur: NEGATIVE mg/dL
Leukocytes,Ua: NEGATIVE
Nitrite: NEGATIVE
Protein, ur: 30 mg/dL — AB
Specific Gravity, Urine: 1.004 — ABNORMAL LOW (ref 1.005–1.030)
pH: 7 (ref 5.0–8.0)

## 2024-01-16 LAB — COMPREHENSIVE METABOLIC PANEL WITH GFR
ALT: 21 U/L (ref 0–44)
AST: 25 U/L (ref 15–41)
Albumin: 4.5 g/dL (ref 3.5–5.0)
Alkaline Phosphatase: 56 U/L (ref 38–126)
Anion gap: 15 (ref 5–15)
BUN: 15 mg/dL (ref 8–23)
CO2: 24 mmol/L (ref 22–32)
Calcium: 9.4 mg/dL (ref 8.9–10.3)
Chloride: 91 mmol/L — ABNORMAL LOW (ref 98–111)
Creatinine, Ser: 0.81 mg/dL (ref 0.44–1.00)
GFR, Estimated: >60 mL/min (ref >60)
Glucose, Bld: 193 mg/dL — ABNORMAL HIGH (ref 70–99)
Potassium: 3.2 mmol/L — ABNORMAL LOW (ref 3.5–5.1)
Sodium: 130 mmol/L — ABNORMAL LOW (ref 135–145)
Total Bilirubin: 0.7 mg/dL (ref 0.0–1.2)
Total Protein: 7.4 g/dL (ref 6.5–8.1)

## 2024-01-16 LAB — CBC
HCT: 40.1 % (ref 36.0–46.0)
Hemoglobin: 13.3 g/dL (ref 12.0–15.0)
MCH: 25 pg — ABNORMAL LOW (ref 26.0–34.0)
MCHC: 33.2 g/dL (ref 30.0–36.0)
MCV: 75.4 fL — ABNORMAL LOW (ref 80.0–100.0)
Platelets: 284 K/uL (ref 150–400)
RBC: 5.32 MIL/uL — ABNORMAL HIGH (ref 3.87–5.11)
RDW: 12.8 % (ref 11.5–15.5)
WBC: 6.8 K/uL (ref 4.0–10.5)
nRBC: 0 % (ref 0.0–0.2)

## 2024-01-16 LAB — LIPASE, BLOOD: Lipase: 35 U/L (ref 11–51)

## 2024-01-16 MED ORDER — IOHEXOL 300 MG/ML  SOLN
100.0000 mL | Freq: Once | INTRAMUSCULAR | Status: AC | PRN
  Administered 2024-01-16: 100 mL via INTRAVENOUS

## 2024-01-16 NOTE — ED Provider Notes (Signed)
 [facility] Provider Note    Event Date/Time   First MD Initiated Contact with Patient 01/16/24 2301     (approximate)   History   Abdominal Pain and Flank Pain   HPI  Pamela Heath is a 68 y.o. female with a history of hypertension, hyperlipidemia, type 2 diabetes, migraines, and GERD presents with right upper abdominal pain for the last 3 to 4 days, intermittent, worse after she eats, and associated with bloating in that area.  She denies any associated nausea or vomiting but has a decreased appetite.  She states she is afraid to eat because of the pain.  She denies any diarrhea.  She had a normal bowel movement yesterday.  She has no urinary symptoms.  She denies any prior history of this pain.  I reviewed the past medical records.  The patient's most recent outpatient encounter was on 9/22 with cardiology for routine follow-up.   Physical Exam   Triage Vital Signs: ED Triage Vitals  Encounter Vitals Group     BP 01/16/24 2019 (!) 168/69     Girls Systolic BP Percentile --      Girls Diastolic BP Percentile --      Boys Systolic BP Percentile --      Boys Diastolic BP Percentile --      Pulse Rate 01/16/24 2019 94     Resp 01/16/24 2019 18     Temp 01/16/24 2019 98 F (36.7 C)     Temp src --      SpO2 01/16/24 2019 98 %     Weight 01/16/24 2022 154 lb (69.9 kg)     Height 01/16/24 2022 5' 2 (1.575 m)     Head Circumference --      Peak Flow --      Pain Score 01/16/24 2022 4     Pain Loc --      Pain Education --      Exclude from Growth Chart --     Most recent vital signs: Vitals:   01/17/24 0000 01/17/24 0029  BP: (!) 160/70   Pulse: 84   Resp: 13   Temp:  98.1 F (36.7 C)  SpO2: 97%      General: Awake, no distress.  CV:  Good peripheral perfusion.  Resp:  Normal effort.  Abd:  Soft with mild right upper quadrant tenderness.  No distention.  Other:  No jaundice or scleral icterus.   ED Results / Procedures /  Treatments   Labs (all labs ordered are listed, but only abnormal results are displayed) Labs Reviewed  COMPREHENSIVE METABOLIC PANEL WITH GFR - Abnormal; Notable for the following components:      Result Value   Sodium 130 (*)    Potassium 3.2 (*)    Chloride 91 (*)    Glucose, Bld 193 (*)    All other components within normal limits  CBC - Abnormal; Notable for the following components:   RBC 5.32 (*)    MCV 75.4 (*)    MCH 25.0 (*)    All other components within normal limits  URINALYSIS, ROUTINE W REFLEX MICROSCOPIC - Abnormal; Notable for the following components:   Color, Urine COLORLESS (*)    APPearance CLEAR (*)    Specific Gravity, Urine 1.004 (*)    Glucose, UA 50 (*)    Protein, ur 30 (*)    All other components within normal limits  LIPASE, BLOOD     EKG  ED ECG REPORT I, Waylon Cassis, the attending physician, personally viewed and interpreted this ECG.  Date: 01/16/2024 EKG Time: 2034 Rate: 92 Rhythm: normal sinus rhythm QRS Axis: normal Intervals: normal ST/T Wave abnormalities: normal Narrative Interpretation: no evidence of acute ischemia    RADIOLOGY  CT abdomen/pelvis: I independently viewed and interpreted the images; there are no dilated bowel loops or any free air or free fluid.  Radiology report indicates the following:  IMPRESSION:  1. Nondistended fluid-filled small bowel with mild wall thickening  suggesting enteritis.  2. Fatty infiltration of the liver.   US  abdomen RUQ: No gallstones or other acute findings   PROCEDURES:  Critical Care performed: No  Procedures   MEDICATIONS ORDERED IN ED: Medications  iohexol  (OMNIPAQUE ) 300 MG/ML solution 100 mL (100 mLs Intravenous Contrast Given 01/16/24 2339)     IMPRESSION / MDM / ASSESSMENT AND PLAN / ED COURSE  I reviewed the triage vital signs and the nursing notes.  68 year old female with PMH as noted above presents with right upper quadrant abdominal pain and some  swelling over the last few days.  On exam the patient is well-appearing.  She has minimal tenderness in that area.  Differential diagnosis includes, but is not limited to, enteritis, colitis, pancreatitis, biliary colic, cholecystitis, other hepatobiliary etiology.  We will obtain CT abdomen/pelvis and lab workup.  If the CT is nondiagnostic I will consider right upper quadrant ultrasound.  Patient's presentation is most consistent with acute complicated illness / injury requiring diagnostic workup.  ----------------------------------------- 2:27 AM on 01/17/2024 -----------------------------------------  CT showed evidence of enteritis but no other concerning acute findings.  Because of the localization of the pain in the right upper quadrant I proceeded with ultrasound to rule out gallstones.  The ultrasound is negative.  On reassessment the patient continues to have no active pain.  She is stable for discharge home.  Presentation is consistent with enteritis.  I have prescribed Bentyl .  I gave strict return precautions, and she expressed understanding.  FINAL CLINICAL IMPRESSION(S) / ED DIAGNOSES   Final diagnoses:  Right upper quadrant pain  Enteritis     Rx / DC Orders   ED Discharge Orders          Ordered    dicyclomine  (BENTYL ) 10 MG capsule  Every 8 hours PRN        01/17/24 0227             Note:  This document was prepared using Dragon voice recognition software and may include unintentional dictation errors.    Cassis Waylon, MD 01/17/24 [phone]

## 2024-01-16 NOTE — ED Triage Notes (Addendum)
 Pt arrives POV ambulatory to triage, no acute distress noted c/o RUQ and right flank pain w/ abd swelling x2 day, denies n/v. Denies difficulty urinating or diarrhea. Pt states pain is worse after eating.

## 2024-01-17 ENCOUNTER — Emergency Department

## 2024-01-17 DIAGNOSIS — K529 Noninfective gastroenteritis and colitis, unspecified: Secondary | ICD-10-CM | POA: Diagnosis not present

## 2024-01-17 MED ORDER — DICYCLOMINE HCL 10 MG PO CAPS: 10.0000 mg | ORAL_CAPSULE | Freq: Three times a day (TID) | ORAL | 0 refills | Status: AC | PRN

## 2024-01-17 NOTE — Discharge Instructions (Addendum)
 You may take the Bentyl  as needed over the next few days to help with the pain.  Return to the ER for new, worsening, or persistent severe abdominal pain, vomiting, fever, weakness, or any other new or worsening symptoms that concern you

## 2024-01-29 ENCOUNTER — Other Ambulatory Visit: Payer: Self-pay | Admitting: Family Medicine

## 2024-01-29 DIAGNOSIS — Z1231 Encounter for screening mammogram for malignant neoplasm of breast: Secondary | ICD-10-CM

## 2024-03-04 ENCOUNTER — Ambulatory Visit
Admission: RE | Admit: 2024-03-04 | Discharge: 2024-03-04 | Disposition: A | Discharge status: Home / Self Care | Source: Ambulatory Visit | Attending: Family Medicine | Admitting: Family Medicine

## 2024-03-04 DIAGNOSIS — Z1231 Encounter for screening mammogram for malignant neoplasm of breast: Secondary | ICD-10-CM | POA: Diagnosis present
